# Patient Record
Sex: Female | Born: 1995 | Race: White | Hispanic: No | Marital: Single | Smoking: Never smoker
Health system: Southern US, Community
[De-identification: ages and names within clinical notes are randomized; demographics above are authoritative.]

## PROBLEM LIST (undated history)

## (undated) DIAGNOSIS — D649 Anemia, unspecified: Secondary | ICD-10-CM

## (undated) DIAGNOSIS — E282 Polycystic ovarian syndrome: Secondary | ICD-10-CM

## (undated) DIAGNOSIS — F32A Depression, unspecified: Secondary | ICD-10-CM

## (undated) HISTORY — PX: ANKLE SURGERY: SHX546

## (undated) HISTORY — DX: Anemia, unspecified: D64.9

## (undated) HISTORY — PX: BACK SURGERY: SHX140

---
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
## 2013-07-28 ENCOUNTER — Emergency Department: Payer: Self-pay | Admitting: Emergency Medicine

## 2013-09-16 ENCOUNTER — Ambulatory Visit: Payer: Self-pay | Admitting: Specialist

## 2014-07-19 NOTE — Op Note (Signed)
PATIENT NAME:  Christine Oconnor, Christine Oconnor MR#:  [id number] DATE OF BIRTH:  23-Jun-1995  DATE OF PROCEDURE:  09/16/2013  DATE OF DICTATION: 09/16/2013   PREOPERATIVE DIAGNOSIS: Painful hardware, right ankle.  POSTOPERATIVE DIAGNOSIS: Painful hardware, right ankle.  OPERATION: Removal of hardware, right ankle, from the fibula medial malleolus and talus.  SURGEON: Valinda HoarHoward E. Miller, MD  ANESTHESIA: General LMA.  COMPLICATIONS: None.  DRAINS: None.  ESTIMATED BLOOD LOSS: None.  REPLACED: None.  OPERATIVE PROCEDURE: The patient was brought to the operating room, where she underwent a satisfactory general LMA anesthesia in the supine position. The right leg was prepped and draped in sterile fashion and Esmarch applied. Tourniquet was inflated to 350 mmHg. The previous longitudinal fibula incision was reopened, and dissection carried out sharply through the subcutaneous tissue. The dissection was carried anteriorly to the fibula, and the plate and screws were exposed. The screws were all removed, and the plate was removed without difficulty. The distal screw had broken in the past, and the broken portion was left in the tibia. This wound was then irrigated and closed with 2-0 Vicryl and staples. The medial incision was reopened and dissection carried out sharply through subcutaneous tissue. The medial malleolus was exposed and the two 4.5 cancellous screws exposed. These screws were removed without difficulty. This wound was again irrigated and closed with 3-0 Vicryl and staples. The incision over the anterolateral talus was reopened and dissection carried out down to bone. The 3 small screws were not immediately visible. Some rongeur was used to remove some of the anterolateral bone, and the 2 distal screws were visualized. These were removed. Fluoroscopy showed that the more proximal screw was still in place. Dissection with a rongeur in the lateral gutter finally revealed this screw quite a ways posteriorly  and protruding into the lateral gutter, which was definitely causing symptoms. This screw was removed as well. Fluoroscopy revealed all hardware was removed except for the broken-off screw in the tibia. The wound was irrigated and closed with 3-0 Vicryl and staples. Marcaine 0.5% was placed in all wounds. Dry sterile dressing on the posterior foot was applied. Tourniquet was deflated, with a good return of blood flow to the foot. The patient was awakened and taken to recovery in good condition.   ____________________________ Valinda HoarHoward E. Miller, MD hem:lb D: 09/16/2013 21:25:30 ET T: 09/17/2013 05:46:55 ET JOB#: [id number]  cc: Valinda HoarHoward E. Miller, MD, <Dictator> Valinda HoarHOWARD E MILLER MD ELECTRONICALLY SIGNED 09/17/2013 15:59

## 2014-07-31 ENCOUNTER — Encounter: Payer: Self-pay | Admitting: *Deleted

## 2014-07-31 ENCOUNTER — Emergency Department: Payer: Medicaid Other

## 2014-07-31 ENCOUNTER — Emergency Department
Admission: EM | Admit: 2014-07-31 | Discharge: 2014-07-31 | Disposition: A | Discharge status: Home / Self Care | Payer: Medicaid Other | Attending: Emergency Medicine | Admitting: Emergency Medicine

## 2014-07-31 DIAGNOSIS — S76011A Strain of muscle, fascia and tendon of right hip, initial encounter: Secondary | ICD-10-CM | POA: Insufficient documentation

## 2014-07-31 DIAGNOSIS — Z3202 Encounter for pregnancy test, result negative: Secondary | ICD-10-CM | POA: Insufficient documentation

## 2014-07-31 DIAGNOSIS — W14XXXA Fall from tree, initial encounter: Secondary | ICD-10-CM | POA: Diagnosis not present

## 2014-07-31 DIAGNOSIS — Y9289 Other specified places as the place of occurrence of the external cause: Secondary | ICD-10-CM | POA: Insufficient documentation

## 2014-07-31 DIAGNOSIS — Y9389 Activity, other specified: Secondary | ICD-10-CM | POA: Diagnosis not present

## 2014-07-31 DIAGNOSIS — Y998 Other external cause status: Secondary | ICD-10-CM | POA: Insufficient documentation

## 2014-07-31 DIAGNOSIS — M25551 Pain in right hip: Secondary | ICD-10-CM

## 2014-07-31 DIAGNOSIS — S79911A Unspecified injury of right hip, initial encounter: Secondary | ICD-10-CM | POA: Diagnosis present

## 2014-07-31 LAB — URINALYSIS COMPLETE WITH MICROSCOPIC (ARMC ONLY)
BILIRUBIN URINE: NEGATIVE
Bacteria, UA: NONE SEEN
Glucose, UA: NEGATIVE mg/dL
HGB URINE DIPSTICK: NEGATIVE
Ketones, ur: NEGATIVE mg/dL
Leukocytes, UA: NEGATIVE
Nitrite: NEGATIVE
Protein, ur: NEGATIVE mg/dL
Specific Gravity, Urine: 1.029 (ref 1.005–1.030)
pH: 6 (ref 5.0–8.0)

## 2014-07-31 MED ORDER — TRAMADOL HCL 50 MG PO TABS: 50.0000 mg | ORAL_TABLET | Freq: Three times a day (TID) | ORAL | Status: AC | PRN

## 2014-07-31 MED ORDER — MELOXICAM 7.5 MG PO TABS
ORAL_TABLET | ORAL | Status: AC
  Administered 2014-07-31: 15 mg via ORAL
  Filled 2014-07-31: qty 2

## 2014-07-31 MED ORDER — MELOXICAM 15 MG PO TABS: 15.0000 mg | ORAL_TABLET | Freq: Every day | ORAL | Status: DC | PRN

## 2014-07-31 MED ORDER — MELOXICAM 7.5 MG PO TABS
15.0000 mg | ORAL_TABLET | Freq: Once | ORAL | Status: AC
  Administered 2014-07-31: 15 mg via ORAL

## 2014-07-31 NOTE — ED Provider Notes (Signed)
[facility] Emergency Department Provider Note   ____________________________________________  Time seen: ----------------------------------------- 8:00 PM on 07/31/2014 -----------------------------------------    I have reviewed the triage vital signs and the nursing notes.   HISTORY  Chief Complaint Hip Pain   HPI Christine Oconnor is a 19 y.o. female with a complaint of right hip pain. Patient states approximately 2 years ago she was in an accident. States she fell out of a tree approximately 15 feet. States at that time she fractured her right hip and right ankle and lower back. States right hip was nonsurgically repaired. States right ankle and lower back were surgically repaired. States occasional pain to right hip since then. However denies recent fall or injury. Patient does report the last 2-3 weeks has been playing a Wii videogame called "just dance". States this particular videogame involves dancing and physical activity. Patient states that pain onset was while playing game. Patient reports this playing game has caused pain to her hip.   Patient states pain is aching and increases to a sharp pain with movement at 6/10. States pain presents only with movement to right lateral hip. Denies pain radiation. Pain comes and goes with activity for 2-3 weeks.   Patient denies fall, trauma, or other injury. Denies back pain, abdominal pain, lower leg pain, dysuria, hematuria, vaginal complaints, fever, or recent sickness.   History reviewed. No pertinent past medical history.  There are no active problems to display for this patient.   Past Surgical History  Procedure Laterality Date  . Ankle surgery    . Back surgery      No current outpatient prescriptions on file.  Allergies Review of patient's allergies indicates no known allergies.  No family history on file.  Social History History  Substance Use Topics  . Smoking status: Never Smoker    . Smokeless tobacco: Not on file  . Alcohol Use: No    Review of Systems Constitutional: Negative for fever. Eyes: Negative for visual changes. ENT: Negative for sore throat. Cardiovascular: Negative for chest pain. Respiratory: Negative for shortness of breath. Gastrointestinal: Negative for abdominal pain, vomiting and diarrhea. Genitourinary: Negative for dysuria. Musculoskeletal: Positive for right lateral hip pain. Negative for back pain. Negative for left lower leg pain. Skin: Negative for rash. Neurological: Negative for headaches, focal weakness or numbness.   10-point ROS otherwise negative.  ____________________________________________   PHYSICAL EXAM:  VITAL SIGNS: ED Triage Vitals  Enc Vitals Group     BP 07/31/14 1855 135/74 mmHg     Pulse Rate 07/31/14 1855 100     Resp 07/31/14 1855 18     Temp 07/31/14 1855 98.3 F (36.8 C)     Temp Source 07/31/14 1855 Oral     SpO2 07/31/14 1855 100 %     Weight 07/31/14 1855 200 lb (90.719 kg)     Height 07/31/14 1855 5\' 7"  (1.702 m)     Head Cir --      Peak Flow --      Pain Score 07/31/14 1853 10     Pain Loc --      Pain Edu? --      Excl. in GC? --      Constitutional: Alert and oriented. Well appearing and in no distress. Eyes: Conjunctivae are normal. PERRL. Normal extraocular movements. ENT   Head: Normocephalic and atraumatic.   Nose: No congestion/rhinnorhea.   Mouth/Throat: Mucous membranes are moist.   Neck: No stridor. Hematological/Lymphatic/Immunilogical: No cervical lymphadenopathy. Cardiovascular:  Normal rate, regular rhythm. Normal and symmetric distal pulses are present in all extremities. No murmurs, rubs, or gallops. Respiratory: Normal respiratory effort without tachypnea nor retractions. Breath sounds are clear and equal bilaterally. No wheezes/rales/rhonchi. Gastrointestinal: Soft and nontender. Obese abdomen. No abdominal bruits. There is no CVA  tenderness. Genitourinary: deferred Musculoskeletal: Nontender with normal range of motion in all extremities. No joint effusions.  No lower extremity tenderness nor edema. Pelvis nontender   EXCEPT: Right lateral hip point tender. Full ROM. Mild to mod. Pain with lateral abduction. No swelling, ecchymosis. Skin intact. Distal right lower leg nontender. Distal pedal pulses equal bilaterally.Changes positions quickly from lying to standing without distress.    Neurologic:  Normal speech and language. No gross focal neurologic deficits are appreciated. Speech is normal. No gait instability. Skin:  Skin is warm, dry and intact. No rash noted. Psychiatric: Mood and affect are normal. Speech and behavior are normal. Patient exhibits appropriate insight and judgment.  ____________________________________________    LABS (pertinent positives/negatives)  Labs Reviewed  URINALYSIS COMPLETEWITH MICROSCOPIC ([facility])  - Abnormal; Notable for the following:    Color, Urine YELLOW (*)    APPearance CLEAR (*)    Squamous Epithelial / LPF 0-5 (*)    All other components within normal limits  POC URINE PREG, ED  urine pregnancy negative   ____________________________________________  RADIOLOGY  EXAM: RIGHT HIP (WITH PELVIS) 2-3 VIEWS  COMPARISON: None.  FINDINGS: There is no evidence of hip fracture or dislocation. No evidence of osteonecrosis or stress reaction. No degenerative changes.  Partially visualized lumbar posterior fixation hardware.  IMPRESSION: Negative.   Electronically Signed By: Marnee SpringJonathon Watts M.D. On: 07/31/2014 21:03    INITIAL IMPRESSION / ASSESSMENT AND PLAN / ED COURSE  Pertinent labs & imaging results that were available during my care of the patient were reviewed by me and considered in my medical decision making (see chart for details).  Well appearing. Steady gait. Denies fall or direct trauma. REports right hip pain consistent with recent  increased in physical activity while playing a dance gamer per pt. Denies pain radiation. Pain present only with movement. Suspect strain injury. Hx of fracture. Will xray.   Xray negative for acute changes. Steady gait. Suspect strain. Ice rest and follow up with orthopedic next week as needed. Reports previously follow up [facility]. ____________________________________________   FINAL CLINICAL IMPRESSION(S) / ED DIAGNOSES  Final diagnoses:  Right hip pain  Hip strain, right, initial encounter     Christine DillsLindsey Braylie Badami, NP 07/31/14 2157

## 2014-07-31 NOTE — ED Notes (Signed)
Pt. States pain to the rt. Hip for the past three weeks.  Pt. States increased pain to rt. Hip in past couple days.  Pt. States fracture to rt. Hip in may 2014.

## 2014-07-31 NOTE — ED Notes (Signed)
Pt reports right hip pain that has become worse over a 3 week period. Pt denies recent injury, only reports falling out of a tree 2 years ago with hip injury

## 2014-07-31 NOTE — Discharge Instructions (Signed)
Take medication as prescribed. Stretch. Alternate heat and ice. Avoid strenuous activity.   Follow up with orthopedic next week as discussed.   Return to ER for new or worsening concerns.

## 2014-12-26 ENCOUNTER — Emergency Department: Payer: BLUE CROSS/BLUE SHIELD

## 2014-12-26 ENCOUNTER — Encounter: Payer: Self-pay | Admitting: Emergency Medicine

## 2014-12-26 ENCOUNTER — Emergency Department
Admission: EM | Admit: 2014-12-26 | Discharge: 2014-12-26 | Disposition: A | Discharge status: Home / Self Care | Payer: BLUE CROSS/BLUE SHIELD | Attending: Student | Admitting: Student

## 2014-12-26 DIAGNOSIS — Y9289 Other specified places as the place of occurrence of the external cause: Secondary | ICD-10-CM | POA: Diagnosis not present

## 2014-12-26 DIAGNOSIS — M545 Low back pain, unspecified: Secondary | ICD-10-CM

## 2014-12-26 DIAGNOSIS — Z3202 Encounter for pregnancy test, result negative: Secondary | ICD-10-CM | POA: Insufficient documentation

## 2014-12-26 DIAGNOSIS — X58XXXA Exposure to other specified factors, initial encounter: Secondary | ICD-10-CM | POA: Diagnosis not present

## 2014-12-26 DIAGNOSIS — Y998 Other external cause status: Secondary | ICD-10-CM | POA: Insufficient documentation

## 2014-12-26 DIAGNOSIS — Y9389 Activity, other specified: Secondary | ICD-10-CM | POA: Diagnosis not present

## 2014-12-26 DIAGNOSIS — S32030A Wedge compression fracture of third lumbar vertebra, initial encounter for closed fracture: Secondary | ICD-10-CM | POA: Diagnosis not present

## 2014-12-26 DIAGNOSIS — Z981 Arthrodesis status: Secondary | ICD-10-CM | POA: Diagnosis not present

## 2014-12-26 LAB — POCT PREGNANCY, URINE: Preg Test, Ur: NEGATIVE

## 2014-12-26 MED ORDER — NAPROXEN 500 MG PO TABS: 500.0000 mg | ORAL_TABLET | Freq: Two times a day (BID) | ORAL | Status: DC

## 2014-12-26 MED ORDER — TRAMADOL HCL 50 MG PO TABS
50.0000 mg | ORAL_TABLET | Freq: Once | ORAL | Status: AC
  Administered 2014-12-26: 50 mg via ORAL
  Filled 2014-12-26: qty 1

## 2014-12-26 MED ORDER — TRAMADOL HCL 50 MG PO TABS: 50.0000 mg | ORAL_TABLET | Freq: Four times a day (QID) | ORAL | Status: DC | PRN

## 2014-12-26 MED ORDER — IBUPROFEN 800 MG PO TABS
800.0000 mg | ORAL_TABLET | Freq: Once | ORAL | Status: AC
  Administered 2014-12-26: 800 mg via ORAL
  Filled 2014-12-26: qty 1

## 2014-12-26 NOTE — ED Provider Notes (Signed)
CSN: [id number]     Arrival date & time 12/26/14  1606 History   First MD Initiated Contact with Patient 12/26/14 1714     Chief Complaint  Patient presents with  . Back Pain     (Consider location/radiation/quality/duration/timing/severity/associated sxs/prior Treatment) HPI  19 year old female resist the emergency department for evaluation of lower back pain. She denies any trauma or injury. Pain is been present for 7 days and she describes a gradual onset of sharp shooting pain in the middle of the lower back. Pain is increased after being on her feet for 8 hours at work. She also has increased pain with bending and lifting. She denies any radiation of pain down the lower extremities. She is able to ambulate with no significant discomfort. No weakness or loss of bowel or bladder symptoms. Patient has had back surgery that was performed in 2014 in [place] after a fall 15 feet out of a tree. She suffered a burst fracture at L3 and was treated with L2 L3-L4 fusion. Patient has not been taking any medications for pain today. Her pain is 4 out of 10.  History reviewed. No pertinent past medical history. Past Surgical History  Procedure Laterality Date  . Ankle surgery    . Back surgery     No family history on file. Social History  Substance Use Topics  . Smoking status: Never Smoker   . Smokeless tobacco: None  . Alcohol Use: No   OB History    No data available     Review of Systems  Constitutional: Negative for fever, chills, activity change and fatigue.  HENT: Negative for congestion, sinus pressure and sore throat.   Eyes: Negative for visual disturbance.  Respiratory: Negative for cough, chest tightness and shortness of breath.   Cardiovascular: Negative for chest pain and leg swelling.  Gastrointestinal: Negative for nausea, vomiting, abdominal pain and diarrhea.  Genitourinary: Negative for dysuria.  Musculoskeletal: Positive for back pain. Negative for arthralgias and gait  problem.  Skin: Negative for rash.  Neurological: Negative for weakness, numbness and headaches.  Hematological: Negative for adenopathy.  Psychiatric/Behavioral: Negative for behavioral problems, confusion and agitation.  All other systems reviewed and are negative.     Allergies  Review of patient's allergies indicates no known allergies.  Home Medications   Prior to Admission medications   Medication Sig Start Date End Date Taking? Authorizing Provider  meloxicam (MOBIC) 15 MG tablet Take 1 tablet (15 mg total) by mouth daily as needed for pain. 07/31/14   Renford Dills, NP  naproxen (NAPROSYN) 500 MG tablet Take 1 tablet (500 mg total) by mouth 2 (two) times daily with a meal. 12/26/14   Evon Slack, PA-C  traMADol (ULTRAM) 50 MG tablet Take 1 tablet (50 mg total) by mouth every 8 (eight) hours as needed for moderate pain (Do not drive or operate heavy machinery while taking as can cause drowsiness.). 07/31/14 07/31/15  Renford Dills, NP  traMADol (ULTRAM) 50 MG tablet Take 1 tablet (50 mg total) by mouth every 6 (six) hours as needed. 12/26/14   Evon Slack, PA-C   BP 136/93 mmHg  Pulse 97  Temp(Src) 98.2 F (36.8 C) (Oral)  Resp 20  Ht  (1.676 m)  Wt 280 lb (127.007 kg)  BMI 45.21 kg/m2  SpO2 100%  LMP 10/25/2014 (Approximate) Physical Exam  Constitutional: She is oriented to person, place, and time. She appears well-developed and well-nourished. No distress.  HENT:  Head: Normocephalic and atraumatic.  Mouth/Throat: Oropharynx is clear and moist.  Eyes: EOM are normal. Pupils are equal, round, and reactive to light. Right eye exhibits no discharge. Left eye exhibits no discharge.  Neck: Normal range of motion. Neck supple.  Cardiovascular: Normal rate, regular rhythm and intact distal pulses.   Pulmonary/Chest: Effort normal and breath sounds normal. No respiratory distress. She exhibits no tenderness.  Abdominal: Soft. She exhibits no distension. There is no  tenderness.  Musculoskeletal: Normal range of motion. She exhibits no edema.  Examination of the lumbar spine shows patient has mild tenderness to palpation along the lumbosacral junction along the spinous process in left and right paravertebral muscles. There is no soft tissue abnormality, warmth erythema or muscle spasms noted. Patient has no significant pain with flexion and extension lateral bending or rotation of the lumbar spine. Examination of the lower extremities show patient has full range of motion of the hips knees and ankles. There is 5 out of 5 strength with hip abduction, hip abduction, hip flexion, knee flexion, knee extension, ankle plantarflexion, ankle dorsiflexion. His station is intact throughout. 2+ patellar reflexes. Straight leg raise bilaterally. Neurovascular intact in bilateral lower extremities.  Neurological: She is alert and oriented to person, place, and time. She has normal reflexes.  Skin: Skin is warm and dry.  Psychiatric: She has a normal mood and affect. Her behavior is normal. Thought content normal.    ED Course  Procedures (including critical care time) Labs Review Labs Reviewed  POC URINE PREG, ED  POCT PREGNANCY, URINE    Imaging Review Dg Lumbar Spine Complete  12/26/2014   CLINICAL DATA:  Back pain today. Patient status post prior lumbar surgery.  EXAM: LUMBAR SPINE - COMPLETE 4+ VIEW  COMPARISON:  None.  FINDINGS: There is compression deformity with lucencies in the L3 vertebral body. Patient status post prior posterior fusion of L2 through L5. The two horizontal screws fixating the L2 level are discontinuous.  IMPRESSION: Findings suspicious for acute compression fracture of L3 and fracture of horizontal screws fixating the L2 level.   Electronically Signed   By: Sherian Rein M.D.   On: 12/26/2014 18:39   I have personally reviewed and evaluated these images and lab results as part of my medical decision-making.   EKG Interpretation None       MDM   Final diagnoses:  Compression fracture of L3 lumbar vertebra, closed, initial encounter  Status post lumbar spinal fusion  Midline low back pain without sciatica    19 year old female with nontraumatic lower back pain. She has a history of L2-L4 fusion with L3 burst compression fracture that occurred in 2014. X-ray report from January 2015 in [place] notes burst fracture at L3 with retropulsion. Lumbar spine x-rays taken today at [facility] also show L3 compression fracture with failure of the top 2 screws. She will follow-up with orthopedic physician first thing Monday am she is given a note to remain out of work for the next week. She will rest. Turn to the ER for any worsening symptoms or urgent changes in her health. She is given a prescription for tramadol and ibuprofen for pain.  Evon Slack, PA-C 12/26/14 2202  Gayla Doss, MD 12/27/14 [phone]

## 2014-12-26 NOTE — Discharge Instructions (Signed)
Back Pain, Adult °Low back pain is very common. About 1 in 5 people have back pain. The cause of low back pain is rarely dangerous. The pain often gets better over time. About half of people with a sudden onset of back pain feel better in just 2 weeks. About 8 in 10 people feel better by 6 weeks.  °CAUSES °Some common causes of back pain include: °· Strain of the muscles or ligaments supporting the spine. °· Wear and tear (degeneration) of the spinal discs. °· Arthritis. °· Direct injury to the back. °DIAGNOSIS °Most of the time, the direct cause of low back pain is not known. However, back pain can be treated effectively even when the exact cause of the pain is unknown. Answering your caregiver's questions about your overall health and symptoms is one of the most accurate ways to make sure the cause of your pain is not dangerous. If your caregiver needs more information, he or she may order lab work or imaging tests (X-rays or MRIs). However, even if imaging tests show changes in your back, this usually does not require surgery. °HOME CARE INSTRUCTIONS °For many people, back pain returns. Since low back pain is rarely dangerous, it is often a condition that people can learn to manage on their own.  °· Remain active. It is stressful on the back to sit or stand in one place. Do not sit, drive, or stand in one place for more than 30 minutes at a time. Take short walks on level surfaces as soon as pain allows. Try to increase the length of time you walk each day. °· Do not stay in bed. Resting more than 1 or 2 days can delay your recovery. °· Do not avoid exercise or work. Your body is made to move. It is not dangerous to be active, even though your back may hurt. Your back will likely heal faster if you return to being active before your pain is gone. °· Pay attention to your body when you  bend and lift. Many people have less discomfort when lifting if they bend their knees, keep the load close to their bodies, and  avoid twisting. Often, the most comfortable positions are those that put less stress on your recovering back. °· Find a comfortable position to sleep. Use a firm mattress and lie on your side with your knees slightly bent. If you lie on your back, put a pillow under your knees. °· Only take over-the-counter or prescription medicines as directed by your caregiver. Over-the-counter medicines to reduce pain and inflammation are often the most helpful. Your caregiver may prescribe muscle relaxant drugs. These medicines help dull your pain so you can more quickly return to your normal activities and healthy exercise. °· Put ice on the injured area. °¨ Put ice in a plastic bag. °¨ Place a towel between your skin and the bag. °¨ Leave the ice on for 15-20 minutes, 03-04 times a day for the first 2 to 3 days. After that, ice and heat may be alternated to reduce pain and spasms. °· Ask your caregiver about trying back exercises and gentle massage. This may be of some benefit. °· Avoid feeling anxious or stressed. Stress increases muscle tension and can worsen back pain. It is important to recognize when you are anxious or stressed and learn ways to manage it. Exercise is a great option. °SEEK MEDICAL CARE IF: °· You have pain that is not relieved with rest or medicine. °· You have pain that does not improve in 1 week. °· You have new symptoms. °· You are generally not feeling well. °SEEK   IMMEDIATE MEDICAL CARE IF:  °· You have pain that radiates from your back into your legs. °· You develop new bowel or bladder control problems. °· You have unusual weakness or numbness in your arms or legs. °· You develop nausea or vomiting. °· You develop abdominal pain. °· You feel faint. °Document Released: 03/14/2005 Document Revised: 09/13/2011 Document Reviewed: 07/16/2013 °ExitCare® Patient Information ©2015 [employer]. This information is not intended to replace advice given to you by your health care provider. Make sure you  discuss any questions you have with your health care provider. ° °Back, Compression Fracture °A compression fracture happens when a force is put upon the length of your spine. Slipping and falling on your bottom are examples of such a force. When this happens, sometimes the force is great enough to compress the building blocks (vertebral bodies) of your spine. Although this causes a lot of pain, this can usually be treated at home, unless your caregiver feels hospitalization is needed for pain control. °Your backbone (spinal column) is made up of 24 main vertebral bodies in addition to the sacrum and coccyx (see illustration). These are held together by tough fibrous tissues (ligaments) and by support of your muscles. Nerve roots pass through the openings between the vertebrae. A sudden wrenching move, injury, or a fall may cause a compression fracture of one of the vertebral bodies. This may result in back pain or spread of pain into the belly (abdomen), the buttocks, and down the leg into the foot. Pain may also be created by muscle spasm alone. °Large studies have been undertaken to determine the best possible course of action to help your back following injury and also to prevent future problems. The recommendations are as follows. °FOLLOWING A COMPRESSION FRACTURE: °Do the following only if advised by your caregiver.  °· If a back brace has been suggested or provided, wear it as directed. °· Do not stop wearing the back brace unless instructed by your caregiver. °· When allowed to return to regular activities, avoid a sedentary lifestyle. Actively exercise. Sporadic weekend binges of tennis, racquetball, or waterskiing may actually aggravate or create problems, especially if you are not in condition for that activity. °· Avoid sports requiring sudden body movements until you are in condition for them. Swimming and walking are safer activities. °· Maintain good posture. °· Avoid obesity. °· If not already done,  you should have a DEXA scan. Based on the results, be treated for osteoporosis. °FOLLOWING ACUTE (SUDDEN) INJURY: °· Only take over-the-counter or prescription medicines for pain, discomfort, or fever as directed by your caregiver. °· Use bed rest for only the most extreme acute episode. Prolonged bed rest may aggravate your condition. Ice used for acute conditions is effective. Use a large plastic bag filled with ice. Wrap it in a towel. This also provides excellent pain relief. This may be continuous. Or use it for 30 minutes every 2 hours during acute phase, then as needed. Heat for 30 minutes prior to activities is helpful. °· As soon as the acute phase (the time when your back is too painful for you to do normal activities) is over, it is important to resume normal activities and work hardening programs. Back injuries can cause potentially marked changes in lifestyle. So it is important to attack these problems aggressively. °· See your caregiver for continued problems. He or she can help or refer you for appropriate exercises, physical therapy, and work hardening if needed. °· If you are given narcotic   medications for your condition, for the next 24 hours do not: °¨ Drive. °¨ Operate machinery or power tools. °¨ Sign legal documents. °· Do not drink alcohol, or take sleeping pills or other medications that may interfere with treatment. °If your caregiver has given you a follow-up appointment, it is very important to keep that appointment. Not keeping the appointment could result in a chronic or permanent injury, pain, and disability. If there is any problem keeping the appointment, you must call back to this facility for assistance.  °SEEK IMMEDIATE MEDICAL CARE IF: °· You develop numbness, tingling, weakness, or problems with the use of your arms or legs. °· You develop severe back pain not relieved with medications. °· You have changes in bowel or bladder control. °· You have increasing pain in any areas of  the body. °Document Released: 03/14/2005 Document Revised: 07/29/2013 Document Reviewed: 10/17/2007 °ExitCare® Patient Information ©2015 [employer]. This information is not intended to replace advice given to you by your health care provider. Make sure you discuss any questions you have with your health care provider. ° °

## 2014-12-26 NOTE — ED Notes (Signed)
States she felt a pop in her back about 1 week ago now having pain to left hip area and back

## 2014-12-26 NOTE — ED Notes (Signed)
AAOx3.  Skin warm and dry.  NAD 

## 2014-12-26 NOTE — ED Notes (Signed)
Says she has left low back pain and has had back problems before and has had surgery there.
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
## 2015-10-06 ENCOUNTER — Encounter: Payer: Self-pay | Admitting: Emergency Medicine

## 2015-10-06 ENCOUNTER — Emergency Department
Admission: EM | Admit: 2015-10-06 | Discharge: 2015-10-06 | Disposition: A | Discharge status: Home / Self Care | Payer: BLUE CROSS/BLUE SHIELD | Attending: Emergency Medicine | Admitting: Emergency Medicine

## 2015-10-06 DIAGNOSIS — K625 Hemorrhage of anus and rectum: Secondary | ICD-10-CM | POA: Insufficient documentation

## 2015-10-06 HISTORY — DX: Polycystic ovarian syndrome: E28.2

## 2015-10-06 LAB — CBC
HCT: 37.3 % (ref 35.0–47.0)
Hemoglobin: 12.3 g/dL (ref 12.0–16.0)
MCH: 24.9 pg — ABNORMAL LOW (ref 26.0–34.0)
MCHC: 33.1 g/dL (ref 32.0–36.0)
MCV: 75.4 fL — ABNORMAL LOW (ref 80.0–100.0)
PLATELETS: 288 10*3/uL (ref 150–440)
RBC: 4.95 MIL/uL (ref 3.80–5.20)
RDW: 15.4 % — AB (ref 11.5–14.5)
WBC: 10.7 10*3/uL (ref 3.6–11.0)

## 2015-10-06 LAB — COMPREHENSIVE METABOLIC PANEL
ALBUMIN: 3.9 g/dL (ref 3.5–5.0)
ALT: 52 U/L (ref 14–54)
AST: 32 U/L (ref 15–41)
Alkaline Phosphatase: 100 U/L (ref 38–126)
Anion gap: 7 (ref 5–15)
BUN: 10 mg/dL (ref 6–20)
CO2: 25 mmol/L (ref 22–32)
CREATININE: 0.72 mg/dL (ref 0.44–1.00)
Calcium: 9 mg/dL (ref 8.9–10.3)
Chloride: 102 mmol/L (ref 101–111)
GFR calc Af Amer: >60 mL/min (ref >60)
GLUCOSE: 92 mg/dL (ref 65–99)
POTASSIUM: 4 mmol/L (ref 3.5–5.1)
Sodium: 134 mmol/L — ABNORMAL LOW (ref 135–145)
Total Bilirubin: 0.7 mg/dL (ref 0.3–1.2)
Total Protein: 7.8 g/dL (ref 6.5–8.1)

## 2015-10-06 LAB — URINALYSIS COMPLETE WITH MICROSCOPIC (ARMC ONLY)
BACTERIA UA: NONE SEEN
BILIRUBIN URINE: NEGATIVE
GLUCOSE, UA: NEGATIVE mg/dL
HGB URINE DIPSTICK: NEGATIVE
Leukocytes, UA: NEGATIVE
Nitrite: NEGATIVE
Protein, ur: 30 mg/dL — AB
Specific Gravity, Urine: 1.027 (ref 1.005–1.030)
Squamous Epithelial / LPF: NONE SEEN
pH: 8 (ref 5.0–8.0)

## 2015-10-06 LAB — PREGNANCY, URINE: Preg Test, Ur: NEGATIVE

## 2015-10-06 NOTE — ED Notes (Signed)
Pt to ed with c/o rectal bleeding.  Pt reports she noticed bright red blood noted in liquid stool today.  States it increased as the day went on today.  Pt states she noticed clots in most recent bm.

## 2015-10-06 NOTE — Discharge Instructions (Signed)
Follow-up with the clinic in 2 days for reevaluation. Return to the emergency department if you continue to have bleeding after 24 hours, if your bleeding gets progressively worse, if you feel dizzy, if you develop chest pain, shortness of breath, abdominal pain or any new symptoms concerning to you.  Gastrointestinal Bleeding Gastrointestinal (GI) bleeding means there is bleeding somewhere along the digestive tract, between the mouth and anus. CAUSES  There are many different problems that can cause GI bleeding. Possible causes include:  Esophagitis. This is inflammation, irritation, or swelling of the esophagus.  Hemorrhoids.These are veins that are full of blood (engorged) in the rectum. They cause pain, inflammation, and may bleed.  Anal fissures.These are areas of painful tearing which may bleed. They are often caused by passing hard stool.  Diverticulosis.These are pouches that form on the colon over time, with age, and may bleed significantly.  Diverticulitis.This is inflammation in areas with diverticulosis. It can cause pain, fever, and bloody stools, although bleeding is rare.  Polyps and cancer. Colon cancer often starts out as precancerous polyps.  Gastritis and ulcers.Bleeding from the upper gastrointestinal tract (near the stomach) may travel through the intestines and produce black, sometimes tarry, often bad smelling stools. In certain cases, if the bleeding is fast enough, the stools may not be black, but red. This condition may be life-threatening. SYMPTOMS   Vomiting bright red blood or material that looks like coffee grounds.  Bloody, black, or tarry stools. DIAGNOSIS  Your caregiver may diagnose your condition by taking your history and performing a physical exam. More tests may be needed, including:  X-rays and other imaging tests.  Esophagogastroduodenoscopy (EGD). This test uses a flexible, lighted tube to look at your esophagus, stomach, and small  intestine.  Colonoscopy. This test uses a flexible, lighted tube to look at your colon. TREATMENT  Treatment depends on the cause of your bleeding.   For bleeding from the esophagus, stomach, small intestine, or colon, the caregiver doing your EGD or colonoscopy may be able to stop the bleeding as part of the procedure.  Inflammation or infection of the colon can be treated with medicines.  Many rectal problems can be treated with creams, suppositories, or warm baths.  Surgery is sometimes needed.  Blood transfusions are sometimes needed if you have lost a lot of blood. If bleeding is slow, you may be allowed to go home. If there is a lot of bleeding, you will need to stay in the hospital for observation. HOME CARE INSTRUCTIONS   Take any medicines exactly as prescribed.  Keep your stools soft by eating foods that are high in fiber. These foods include whole grains, legumes, fruits, and vegetables. Prunes (1 to 3 a day) work well for many people.  Drink enough fluids to keep your urine clear or pale yellow. SEEK IMMEDIATE MEDICAL CARE IF:   Your bleeding increases.  You feel lightheaded, weak, or you faint.  You have severe cramps in your back or abdomen.  You pass large blood clots in your stool.  Your problems are getting worse. MAKE SURE YOU:   Understand these instructions.  Will watch your condition.  Will get help right away if you are not doing well or get worse.   This information is not intended to replace advice given to you by your health care provider. Make sure you discuss any questions you have with your health care provider.   Document Released: 03/11/2000 Document Revised: 02/29/2012 Document Reviewed: 09/01/2014 Elsevier Interactive Patient  Education ©2016 [employer] ° °

## 2015-10-06 NOTE — ED Provider Notes (Signed)
[facility] Emergency Department Provider Note  ____________________________________________  Time seen: Approximately 8:36 PM  I have reviewed the triage vital signs and the nursing notes.   HISTORY  Chief Complaint Rectal Bleeding   HPI Christine Oconnor is a 20 y.o. female with h/o PCOS presents for evaluation of rectal bleeding. Patient reports 2 episodes of bright red blood per rectum. She describes the total amount of blood she lost half of one of the urine sample cups. She denies any prior history of GI bleed. She denies NSAIDs. She denies abdominal pain, nausea, vomiting. She reports that her stool is watery. She has a history of chronic diarrhea for a few months. The last one was earlier today. She denies dizziness, chest pain, shortness of breath. She does not take any blood thinners. She denies any history of hemorrhoids. She denies any recent travel, any different foods, fever, nausea, vomiting. She had mild abdominal cramping earlier today but that has resolved.  Past Medical History  Diagnosis Date  . PCOS (polycystic ovarian syndrome)     There are no active problems to display for this patient.   Past Surgical History  Procedure Laterality Date  . Ankle surgery    . Back surgery      Current Outpatient Rx  Name  Route  Sig  Dispense  Refill  . meloxicam (MOBIC) 15 MG tablet   Oral   Take 1 tablet (15 mg total) by mouth daily as needed for pain.   10 tablet   0   . naproxen (NAPROSYN) 500 MG tablet   Oral   Take 1 tablet (500 mg total) by mouth 2 (two) times daily with a meal.   30 tablet   0   . traMADol (ULTRAM) 50 MG tablet   Oral   Take 1 tablet (50 mg total) by mouth every 6 (six) hours as needed.   20 tablet   0     Allergies Review of patient's allergies indicates no known allergies.  History reviewed. No pertinent family history.  Social History Social History  Substance Use Topics  . Smoking status: Never  Smoker   . Smokeless tobacco: None  . Alcohol Use: No    Review of Systems  Constitutional: Negative for fever. Eyes: Negative for visual changes. ENT: Negative for sore throat. Cardiovascular: Negative for chest pain. Respiratory: Negative for shortness of breath. Gastrointestinal: Negative for abdominal pain, vomiting or diarrhea. + BRBPR Genitourinary: Negative for dysuria. Musculoskeletal: Negative for back pain. Skin: Negative for rash. Neurological: Negative for headaches, weakness or numbness.  ____________________________________________   PHYSICAL EXAM:  VITAL SIGNS: ED Triage Vitals  Enc Vitals Group     BP 10/06/15 1859 143/100 mmHg     Pulse Rate 10/06/15 1859 82     Resp 10/06/15 1859 20     Temp 10/06/15 1859 98.9 F (37.2 C)     Temp Source 10/06/15 1859 Oral     SpO2 10/06/15 1859 100 %     Weight 10/06/15 1859 300 lb (136.079 kg)     Height 10/06/15 1859 5\' 6"  (1.676 m)     Head Cir --      Peak Flow --      Pain Score 10/06/15 1900 1     Pain Loc --      Pain Edu? --      Excl. in GC? --     Constitutional: Alert and oriented. Well appearing and in no apparent distress. HEENT:  Head: Normocephalic and atraumatic.         Eyes: Conjunctivae are normal. Sclera is non-icteric. EOMI. PERRL      Mouth/Throat: Mucous membranes are moist.       Neck: Supple with no signs of meningismus. Cardiovascular: Regular rate and rhythm. No murmurs, gallops, or rubs. 2+ symmetrical distal pulses are present in all extremities. No JVD. Respiratory: Normal respiratory effort. Lungs are clear to auscultation bilaterally. No wheezes, crackles, or rhonchi.  Gastrointestinal: Soft, non tender, and non distended with positive bowel sounds. No rebound or guarding.Rectal exam with no stool in the vault and Hemoccult negative  Genitourinary: No blood in the introitus of the vagina Neurologic: Normal speech and language. Face is symmetric. Moving all extremities. No  gross focal neurologic deficits are appreciated. Skin: Skin is warm, dry and intact. No rash noted. Psychiatric: Mood and affect are normal. Speech and behavior are normal.  ____________________________________________   LABS (all labs ordered are listed, but only abnormal results are displayed)  Labs Reviewed  COMPREHENSIVE METABOLIC PANEL - Abnormal; Notable for the following:    Sodium 134 (*)    All other components within normal limits  CBC - Abnormal; Notable for the following:    MCV 75.4 (*)    MCH 24.9 (*)    RDW 15.4 (*)    All other components within normal limits  URINALYSIS COMPLETEWITH MICROSCOPIC ([facility] ONLY) - Abnormal; Notable for the following:    Color, Urine YELLOW (*)    APPearance CLEAR (*)    Ketones, ur 1+ (*)    Protein, ur 30 (*)    All other components within normal limits  GASTROINTESTINAL PANEL BY PCR, STOOL (REPLACES STOOL CULTURE)  PREGNANCY, URINE  POC OCCULT BLOOD, ED   ____________________________________________  EKG  None ____________________________________________  RADIOLOGY  none  ____________________________________________   PROCEDURES  Procedure(s) performed: None Critical Care performed:  None ____________________________________________   INITIAL IMPRESSION / ASSESSMENT AND PLAN / ED COURSE  20 y.o. female with h/o PCOS presents for evaluation of 2 episodes of bright red blood per rectum, small amounts, has resolved at this time. Patient hemodynamically stable, no abdominal pain, no other complaints at this time. Rectal exam with no stool in the vault and Hemoccult negative, no blood in her vagina. Labs show a stable H&H, normal kidney function, UA with no evidence of infection or blood. Diagnosis possibly internal hemorrhoids versus infectious diarrhea. Will sedn sample of stool to the lab. No indication for admission or transfusion.  Pertinent labs & imaging results that were available during my care of the patient were  reviewed by me and considered in my medical decision making (see chart for details).    ____________________________________________   FINAL CLINICAL IMPRESSION(S) / ED DIAGNOSES  Final diagnoses:  BRBPR (bright red blood per rectum)      NEW MEDICATIONS STARTED DURING THIS VISIT:  New Prescriptions   No medications on file     Note:  This document was prepared using Dragon voice recognition software and may include unintentional dictation errors.    Nita Sickle, MD 10/06/15 2212

## 2016-12-15 DIAGNOSIS — E282 Polycystic ovarian syndrome: Secondary | ICD-10-CM | POA: Insufficient documentation

## 2017-04-03 ENCOUNTER — Emergency Department: Payer: 59

## 2017-04-03 ENCOUNTER — Emergency Department
Admission: EM | Admit: 2017-04-03 | Discharge: 2017-04-03 | Disposition: A | Discharge status: Home / Self Care | Payer: 59 | Attending: Emergency Medicine | Admitting: Emergency Medicine

## 2017-04-03 ENCOUNTER — Other Ambulatory Visit: Payer: Self-pay

## 2017-04-03 ENCOUNTER — Encounter: Payer: Self-pay | Admitting: Emergency Medicine

## 2017-04-03 DIAGNOSIS — M545 Low back pain, unspecified: Secondary | ICD-10-CM

## 2017-04-03 DIAGNOSIS — Z79899 Other long term (current) drug therapy: Secondary | ICD-10-CM | POA: Diagnosis not present

## 2017-04-03 LAB — URINALYSIS, COMPLETE (UACMP) WITH MICROSCOPIC
Bilirubin Urine: NEGATIVE
Glucose, UA: NEGATIVE mg/dL
HGB URINE DIPSTICK: NEGATIVE
Ketones, ur: NEGATIVE mg/dL
NITRITE: NEGATIVE
PROTEIN: NEGATIVE mg/dL
SPECIFIC GRAVITY, URINE: 1.027 (ref 1.005–1.030)
pH: 7 (ref 5.0–8.0)

## 2017-04-03 LAB — POCT PREGNANCY, URINE: Preg Test, Ur: NEGATIVE

## 2017-04-03 MED ORDER — MELOXICAM 15 MG PO TABS: 15.0000 mg | ORAL_TABLET | Freq: Every day | ORAL | 1 refills | Status: AC

## 2017-04-03 MED ORDER — CYCLOBENZAPRINE HCL 10 MG PO TABS
10.0000 mg | ORAL_TABLET | Freq: Once | ORAL | Status: AC
  Administered 2017-04-03: 10 mg via ORAL
  Filled 2017-04-03: qty 1

## 2017-04-03 MED ORDER — KETOROLAC TROMETHAMINE 10 MG PO TABS: 10.0000 mg | ORAL_TABLET | Freq: Three times a day (TID) | ORAL | 0 refills | Status: DC

## 2017-04-03 MED ORDER — KETOROLAC TROMETHAMINE 30 MG/ML IJ SOLN
30.0000 mg | Freq: Once | INTRAMUSCULAR | Status: AC
  Administered 2017-04-03: 30 mg via INTRAMUSCULAR
  Filled 2017-04-03: qty 1

## 2017-04-03 MED ORDER — GABAPENTIN 100 MG PO CAPS: 100.0000 mg | ORAL_CAPSULE | Freq: Every day | ORAL | 0 refills | Status: DC

## 2017-04-03 MED ORDER — CYCLOBENZAPRINE HCL 5 MG PO TABS: 5.0000 mg | ORAL_TABLET | Freq: Three times a day (TID) | ORAL | 0 refills | Status: DC | PRN

## 2017-04-03 NOTE — Discharge Instructions (Signed)
Your exam likely represents some muscle strain and back pain related to your underlying mild arthritis. Take the prescription meds as directed. [facility]. Return to the ED as discussed.

## 2017-04-03 NOTE — ED Triage Notes (Signed)
Low back pain radiating to both thighs began yesterday. Denies injury.

## 2017-04-03 NOTE — ED Provider Notes (Signed)
[name] Emergency Department Provider Note ____________________________________________  Time seen: 1937  I have reviewed the triage vital signs and the nursing notes.  HISTORY  Chief Complaint  Back Pain  HPI Christine Oconnor is a 22 y.o. female presents to the ED for evaluation of low back pain with bilateral anterior thigh pain. She notes onset yesterday, without offending factors. She notes pain is worse with sitting. She denies any distal paresthesias, foot drop, bladder/bowel incontinence. She has a history of L3 compression fracture s/p L2-L454fusion. She had failure of the screws and subsequent [name]. She presents today with tightness and pressure to the lower back with referral to the thighs. She has taken cyclobenzaprine and meloxicam without relief.   Past Medical History:  Diagnosis Date  . PCOS (polycystic ovarian syndrome)     There are no active problems to display for this patient.   Past Surgical History:  Procedure Laterality Date  . ANKLE SURGERY    . BACK SURGERY      Prior to Admission medications   Medication Sig Start Date End Date Taking? Authorizing Provider  cyclobenzaprine (FLEXERIL) 5 MG tablet Take 1 tablet (5 mg total) by mouth 3 (three) times daily as needed for muscle spasms. 04/03/17   Margrete Delude, Charlesetta IvoryJenise V Bacon, PA-C  gabapentin (NEURONTIN) 100 MG capsule Take 1 capsule (100 mg total) by mouth at bedtime. 04/03/17 05/03/17  Marlene Pfluger, Charlesetta IvoryJenise V Bacon, PA-C  ketorolac (TORADOL) 10 MG tablet Take 1 tablet (10 mg total) by mouth every 8 (eight) hours. 04/03/17   Lisaanne Lawrie, Charlesetta IvoryJenise V Bacon, PA-C  meloxicam (MOBIC) 15 MG tablet Take 1 tablet (15 mg total) by mouth daily as needed for pain. 07/31/14   Renford DillsMiller, Lindsey, NP  meloxicam (MOBIC) 15 MG tablet Take 1 tablet (15 mg total) by mouth daily. 04/03/17 06/02/17  Ritchard Paragas, Charlesetta IvoryJenise V Bacon, PA-C    Allergies Patient has no known allergies.  No family history on  file.  Social History Social History   Tobacco Use  . Smoking status: Never Smoker  Substance Use Topics  . Alcohol use: No  . Drug use: No    Review of Systems  Constitutional: Negative for fever. Cardiovascular: Negative for chest pain. Respiratory: Negative for shortness of breath. Gastrointestinal: Negative for abdominal pain, vomiting and diarrhea. Genitourinary: Negative for dysuria. Musculoskeletal: Positive for back pain. Skin: Negative for rash. Neurological: Negative for headaches, focal weakness or numbness. ____________________________________________  PHYSICAL EXAM:  VITAL SIGNS: ED Triage Vitals  Enc Vitals Group     BP 04/03/17 1842 131/87     Pulse Rate 04/03/17 1842 80     Resp 04/03/17 1842 18     Temp 04/03/17 1842 99 F (37.2 C)     Temp Source 04/03/17 1842 Oral     SpO2 04/03/17 1842 98 %     Weight 04/03/17 1843 (!) 310 lb (140.6 kg)     Height 04/03/17 1843 5\' 6"  (1.676 m)     Head Circumference --      Peak Flow --      Pain Score 04/03/17 1842 6     Pain Loc --      Pain Edu? --      Excl. in GC? --     Constitutional: Alert and oriented. Well appearing and in no distress. Head: Normocephalic and atraumatic. Cardiovascular: Normal rate, regular rhythm. Normal distal pulses. Respiratory: Normal respiratory effort. No wheezes/rales/rhonchi. Gastrointestinal: Soft and nontender. No distention.  Musculoskeletal: No spinal alignment without midline tenderness, spasm, deformity, or step-off.  Patient with stable appearing lumbar sacral surgical scars.  She is with some tenderness palpation over the SI joints and the central sacrum.  She transitions from sit to stand without assistance.  Normal toe and heel raise on exam.  Normal lumbar flexion and extension range are noted.  She also has normal hip flexion on exam.  Nontender with normal range of motion in all extremities.  Neurologic: Nerves II through XII grossly intact.  Normal LE DTRs.   Normal toe dorsiflexion foot eversion.  Normal gait without ataxia. Normal speech and language. No gross focal neurologic deficits are appreciated. Skin:  Skin is warm, dry and intact. No rash noted. Psychiatric: Mood and affect are normal. Patient exhibits appropriate insight and judgment. ____________________________________________   LABS (pertinent positives/negatives)  Labs Reviewed  URINALYSIS, COMPLETE (UACMP) WITH MICROSCOPIC - Abnormal; Notable for the following components:      Result Value   Color, Urine YELLOW (*)    APPearance CLOUDY (*)    Leukocytes, UA TRACE (*)    Bacteria, UA RARE (*)    Squamous Epithelial / LPF 6-30 (*)    All other components within normal limits  POCT PREGNANCY, URINE  POC URINE PREG, ED  ____________________________________________   RADIOLOGY  Lumbar Spine   IMPRESSION: 1. No acute abnormality. 2. Remote L3 compression fracture unchanged in alignment. Majority of the prior posterior fusion hardware has been removed, the L2 pedicle screws remain in situ.  I, [name], Charlesetta Ivory, personally viewed and evaluated these images (plain radiographs) as part of my medical decision making, as well as reviewing the written report by the radiologist. ____________________________________________  PROCEDURES  Procedures Toradol 30 mg IM Flexeril 10 mg PO ____________________________________________  INITIAL IMPRESSION / ASSESSMENT AND PLAN / ED COURSE  Presents to the ED with acute back pain over the last 24 hours.  No preceding injury, accident, trauma reported.  She denies any distal paresthesias but does report bilateral thigh muscle pain.  Her history is significant for previous L3 compression fracture with fusion and subsequent fusion hardware removal in January 2017.  Patient's exam is overall benign without any acute neuromuscular deficit.  Her x-rays also reassuring at this time.  She reports improvement of symptoms following the  medication administration.  She will be discharged with prescriptions for ketorolac, Flexeril, Neurontin, and meloxicam to dose long-term.  She is further advised to follow-up with [facility] neurosurgery for ongoing symptoms.  Return precautions have been reviewed. ____________________________________________  FINAL CLINICAL IMPRESSION(S) / ED DIAGNOSES  Final diagnoses:  Acute midline low back pain without sciatica      Karmen Stabs, Charlesetta Ivory, PA-C 04/03/17 2316    Rockne Menghini, MD 04/03/17 [id number]

## 2017-08-09 ENCOUNTER — Emergency Department
Admission: EM | Admit: 2017-08-09 | Discharge: 2017-08-09 | Disposition: A | Discharge status: Home / Self Care | Payer: 59 | Attending: Emergency Medicine | Admitting: Emergency Medicine

## 2017-08-09 ENCOUNTER — Encounter: Payer: Self-pay | Admitting: Emergency Medicine

## 2017-08-09 ENCOUNTER — Other Ambulatory Visit: Payer: Self-pay

## 2017-08-09 DIAGNOSIS — J209 Acute bronchitis, unspecified: Secondary | ICD-10-CM | POA: Insufficient documentation

## 2017-08-09 DIAGNOSIS — R05 Cough: Secondary | ICD-10-CM | POA: Diagnosis present

## 2017-08-09 MED ORDER — PREDNISONE 10 MG (21) PO TBPK: ORAL_TABLET | ORAL | 0 refills | Status: DC

## 2017-08-09 MED ORDER — AZITHROMYCIN 250 MG PO TABS: ORAL_TABLET | ORAL | 0 refills | Status: DC

## 2017-08-09 MED ORDER — IPRATROPIUM-ALBUTEROL 0.5-2.5 (3) MG/3ML IN SOLN
3.0000 mL | Freq: Once | RESPIRATORY_TRACT | Status: AC
  Administered 2017-08-09: 3 mL via RESPIRATORY_TRACT
  Filled 2017-08-09: qty 3

## 2017-08-09 NOTE — ED Provider Notes (Signed)
[facility] Emergency Department Provider Note  ____________________________________________   First MD Initiated Contact with Patient 08/09/17 1351     (approximate)  I have reviewed the triage vital signs and the nursing notes.   HISTORY  Chief Complaint Cough    HPI DEISY OZBUN is a 22 y.o. female presents emergency department complaining of cough and congestion.  States that symptoms for 3 days.  States she feels like she is having difficulty breathing.  Her friend was just diagnosed with pneumonia and she is concerned she has the same.  She denies any fever or chills.  No vomiting or diarrhea.  No chest pain  Past Medical History:  Diagnosis Date  . PCOS (polycystic ovarian syndrome)     There are no active problems to display for this patient.   Past Surgical History:  Procedure Laterality Date  . ANKLE SURGERY    . BACK SURGERY      Prior to Admission medications   Medication Sig Start Date End Date Taking? Authorizing Provider  azithromycin (ZITHROMAX Z-PAK) 250 MG tablet 2 pills today then 1 pill a day for 4 days 08/09/17   Sherrie Mustache Roselyn Bering, PA-C  gabapentin (NEURONTIN) 100 MG capsule Take 1 capsule (100 mg total) by mouth at bedtime. 04/03/17 05/03/17  Menshew, Charlesetta Ivory, PA-C  meloxicam (MOBIC) 15 MG tablet Take 1 tablet (15 mg total) by mouth daily as needed for pain. 07/31/14   Renford Dills, NP  predniSONE (STERAPRED UNI-PAK 21 TAB) 10 MG (21) TBPK tablet Take 6 pills on day one then decrease by 1 pill each day 08/09/17   Faythe Ghee, PA-C    Allergies Patient has no known allergies.  No family history on file.  Social History Social History   Tobacco Use  . Smoking status: Never Smoker  . Smokeless tobacco: Never Used  Substance Use Topics  . Alcohol use: No  . Drug use: No    Review of Systems  Constitutional: No fever/chills Eyes: No visual changes. ENT: No sore throat. Respiratory: Positive for  cough Genitourinary: Negative for dysuria. Musculoskeletal: Negative for back pain. Skin: Negative for rash.    ____________________________________________   PHYSICAL EXAM:  VITAL SIGNS: ED Triage Vitals  Enc Vitals Group     BP 08/09/17 1400 125/72     Pulse Rate 08/09/17 1400 (!) 113     Resp 08/09/17 1400 20     Temp 08/09/17 1400 98.2 F (36.8 C)     Temp Source 08/09/17 1400 Oral     SpO2 08/09/17 1400 98 %     Weight 08/09/17 1400 290 lb (131.5 kg)     Height 08/09/17 1400  (1.651 m)     Head Circumference --      Peak Flow --      Pain Score 08/09/17 1359 2     Pain Loc --      Pain Edu? --      Excl. in GC? --     Constitutional: Alert and oriented. Well appearing and in no acute distress. Eyes: Conjunctivae are normal.  Head: Atraumatic. Nose: No congestion/rhinnorhea. Mouth/Throat: Mucous membranes are moist.  Throat is normal Cardiovascular: Normal rate, regular rhythm.  Heart sounds are normal Respiratory: Normal respiratory effort.  No retractions, lungs are clear to auscultation, there is some decreased air movement in the lower lung fields, cough is dry and hacking GU: deferred Musculoskeletal: FROM all extremities, warm and well perfused Neurologic:  Normal  speech and language.  Skin:  Skin is warm, dry and intact. No rash noted. Psychiatric: Mood and affect are normal. Speech and behavior are normal.  ____________________________________________   LABS (all labs ordered are listed, but only abnormal results are displayed)  Labs Reviewed - No data to display ____________________________________________   ____________________________________________  RADIOLOGY    ____________________________________________   PROCEDURES  Procedure(s) performed: DuoNeb  Procedures    ____________________________________________   INITIAL IMPRESSION / ASSESSMENT AND PLAN / ED COURSE  Pertinent labs & imaging results that were available  during my care of the patient were reviewed by me and considered in my medical decision making (see chart for details).  Patient is 22 year old female presents emergency department complaining of cough and congestion for 3 days.  She states she feels like she is having difficulty breathing.  She denies any chest pain.  She states her friend has pneumonia and she is afraid she has the same.  She denies fever or chills.  On physical exam the patient appears well.  She does have a dry hacking cough.  Lungs are clear to auscultation but do have some decreased air movement in the lower lung fields.  Heart sounds are normal.  After the DuoNeb the patient has increased air movement in the lower lungs.  Explained the exam findings to the patient.  Diagnosis is acute bronchitis.  She is to follow-up with her regular doctor if not better in 3 to 5 days.  She is given prescription for Z-Pak and steroid pack.  Take over-the-counter cough medicines as needed.  Return emergency department if worsening.  She states she understands will comply with our recommendations.  She is discharged in stable condition     As part of my medical decision making, I reviewed the following data within the electronic MEDICAL RECORD NUMBER Nursing notes reviewed and incorporated, Old chart reviewed, Notes from prior ED visits and [place] Controlled Substance Database  ____________________________________________   FINAL CLINICAL IMPRESSION(S) / ED DIAGNOSES  Final diagnoses:  Acute bronchitis, unspecified organism      NEW MEDICATIONS STARTED DURING THIS VISIT:  New Prescriptions   AZITHROMYCIN (ZITHROMAX Z-PAK) 250 MG TABLET    2 pills today then 1 pill a day for 4 days   PREDNISONE (STERAPRED UNI-PAK 21 TAB) 10 MG (21) TBPK TABLET    Take 6 pills on day one then decrease by 1 pill each day     Note:  This document was prepared using Dragon voice recognition software and may include unintentional dictation errors.     Faythe Ghee, PA-C 08/09/17 1510    Nita Sickle, MD 08/12/17 [phone]

## 2017-08-09 NOTE — ED Triage Notes (Signed)
Presents with a 3 day hx of cough which is occasionally prod  Denies any fever

## 2017-08-09 NOTE — Discharge Instructions (Addendum)
See your Regular doctor if not better in 3 days.  Use medication as prescribed.  Return emergency department if worsening.

## 2018-03-16 ENCOUNTER — Ambulatory Visit
Admission: EM | Admit: 2018-03-16 | Discharge: 2018-03-16 | Disposition: A | Discharge status: Home / Self Care | Payer: 59 | Attending: Family Medicine | Admitting: Family Medicine

## 2018-03-16 ENCOUNTER — Other Ambulatory Visit: Payer: Self-pay

## 2018-03-16 DIAGNOSIS — G5702 Lesion of sciatic nerve, left lower limb: Secondary | ICD-10-CM | POA: Diagnosis not present

## 2018-03-16 MED ORDER — MELOXICAM 15 MG PO TABS: 15.0000 mg | ORAL_TABLET | Freq: Every day | ORAL | 0 refills | Status: DC | PRN

## 2018-03-16 MED ORDER — KETOROLAC TROMETHAMINE 60 MG/2ML IM SOLN
60.0000 mg | Freq: Once | INTRAMUSCULAR | Status: AC
  Administered 2018-03-16: 60 mg via INTRAMUSCULAR

## 2018-03-16 MED ORDER — TIZANIDINE HCL 4 MG PO TABS: 4.0000 mg | ORAL_TABLET | Freq: Four times a day (QID) | ORAL | 0 refills | Status: DC | PRN

## 2018-03-16 NOTE — ED Provider Notes (Signed)
[facility]    CSN: [id number] Arrival date & time: 03/16/18  1244  History   Chief Complaint Chief Complaint  Patient presents with  . Hip Pain   HPI   22 year old female with a history of lumbar fracture and subsequent fusion with later removal of her hardware presents with "hip pain".  Patient reports a 2 to 3-week history of left hip pain.  Patient localizes the pain to the piriformis region of her left buttock.  Patient states that it is worse when she sits.  Patient states that her job requires her to sit for prolonged periods of time.  Has been worse over the past 3 days.  She states it improves when she stands up.  No medications or interventions tried.  No radicular symptoms.  No other associated symptoms.  No other complaints.  Hx reviewed as below. Past Medical History:  Diagnosis Date  . PCOS (polycystic ovarian syndrome)    Past Surgical History:  Procedure Laterality Date  . ANKLE SURGERY    . BACK SURGERY     OB History    Gravida  0   Para  0   Term  0   Preterm  0   AB  0   Living  0     SAB  0   TAB  0   Ectopic  0   Multiple  0   Live Births             Home Medications    Prior to Admission medications   Medication Sig Start Date End Date Taking? Authorizing Provider  levonorgestrel-ethinyl estradiol (AVIANE,ALESSE,LESSINA) 0.1-20 MG-MCG tablet Take by mouth. 10/25/17 10/25/18 Yes [provider]  metFORMIN (GLUCOPHAGE) 500 MG tablet Take by mouth. 10/25/17 10/25/18 Yes [provider]  topiramate (TOPAMAX) 100 MG tablet Take by mouth. 10/25/17 10/25/18 Yes [provider]  gabapentin (NEURONTIN) 100 MG capsule Take 1 capsule (100 mg total) by mouth at bedtime. 04/03/17 05/03/17  Menshew, Charlesetta IvoryJenise V Bacon, PA-C  meloxicam (MOBIC) 15 MG tablet Take 1 tablet (15 mg total) by mouth daily as needed. 03/16/18   Tommie Samsook, Finesse Fielder G, DO  tiZANidine (ZANAFLEX) 4 MG tablet Take 1 tablet (4 mg total) by mouth every 6  (six) hours as needed for muscle spasms. 03/16/18   Tommie Samsook, Calyb Mcquarrie G, DO   Social History Social History   Tobacco Use  . Smoking status: Never Smoker  . Smokeless tobacco: Never Used  Substance Use Topics  . Alcohol use: No  . Drug use: No   Allergies   Patient has no known allergies.   Review of Systems Review of Systems  Constitutional: Negative.   Musculoskeletal:       Pain - buttock.   Physical Exam Triage Vital Signs ED Triage Vitals  Enc Vitals Group     BP 03/16/18 1253 135/89     Pulse Rate 03/16/18 1253 100     Resp 03/16/18 1253 18     Temp 03/16/18 1253 98.6 F (37 C)     Temp Source 03/16/18 1253 Oral     SpO2 03/16/18 1253 100 %     Weight 03/16/18 1254 284 lb (128.8 kg)     Height 03/16/18 1254 5\' 6"  (1.676 m)     Head Circumference --      Peak Flow --      Pain Score 03/16/18 1253 3     Pain Loc --      Pain Edu? --  Excl. in GC? --    Updated Vital Signs BP 135/89 (BP Location: Left Arm)   Pulse 100   Temp 98.6 F (37 C) (Oral)   Resp 18   Ht 5\' 6"  (1.676 m)   Wt 128.8 kg   LMP 03/02/2018   SpO2 100%   BMI 45.84 kg/m   Visual Acuity Right Eye Distance:   Left Eye Distance:   Bilateral Distance:    Right Eye Near:   Left Eye Near:    Bilateral Near:     Physical Exam Vitals signs and nursing note reviewed.  Constitutional:      General: She is not in acute distress.    Appearance: Normal appearance.  HENT:     Head: Normocephalic and atraumatic.     Nose: Nose normal.  Eyes:     Conjunctiva/sclera: Conjunctivae normal.  Cardiovascular:     Rate and Rhythm: Normal rate and regular rhythm.  Pulmonary:     Effort: Pulmonary effort is normal.     Breath sounds: No wheezing, rhonchi or rales.  Musculoskeletal:     Comments: Patient with tenderness of the left piriformis muscle.   Neurological:     Mental Status: She is alert.  Psychiatric:        Mood and Affect: Mood normal.        Behavior: Behavior normal.    UC  Treatments / Results  Labs (all labs ordered are listed, but only abnormal results are displayed) Labs Reviewed - No data to display  EKG None  Radiology No results found.  Procedures Procedures (including critical care time)  Medications Ordered in UC Medications  ketorolac (TORADOL) injection 60 mg (60 mg Intramuscular Given 03/16/18 1333)    Initial Impression / Assessment and Plan / UC Course  I have reviewed the triage vital signs and the nursing notes.  Pertinent labs & imaging results that were available during my care of the patient were reviewed by me and considered in my medical decision making (see chart for details).    22 year old female presents with piriformis syndrome.  Advised exercises.  Exercises given.  Meloxicam and Zanaflex.  Toradol given today.  Final Clinical Impressions(s) / UC Diagnoses   Final diagnoses:  Piriformis syndrome of left side     Discharge Instructions     Exercises.  Medication as prescribed.  Take care  Dr. Adriana Simasook    ED Prescriptions    Medication Sig Dispense Auth. Provider   meloxicam (MOBIC) 15 MG tablet Take 1 tablet (15 mg total) by mouth daily as needed. 30 tablet Lillyona Polasek G, DO   tiZANidine (ZANAFLEX) 4 MG tablet Take 1 tablet (4 mg total) by mouth every 6 (six) hours as needed for muscle spasms. 30 tablet Tommie Samsook, Susette Seminara G, DO     Controlled Substance Prescriptions [place] Controlled Substance Registry consulted? Not Applicable   Tommie SamsCook, Vinaya Sancho G, DO 03/16/18 1645

## 2018-03-16 NOTE — Discharge Instructions (Signed)
Exercises.  Medication as prescribed.  Take care  Dr. Adriana Simasook

## 2018-03-16 NOTE — ED Triage Notes (Signed)
3 weeks of left hip pain, intermittent and much worse with sitting. Last 3 days pain has been more constant. Denies radiation

## 2018-04-08 ENCOUNTER — Other Ambulatory Visit: Payer: Self-pay | Admitting: Family Medicine

## 2018-04-09 ENCOUNTER — Other Ambulatory Visit: Payer: Self-pay

## 2018-04-09 ENCOUNTER — Encounter: Payer: Self-pay | Admitting: Emergency Medicine

## 2018-04-09 ENCOUNTER — Ambulatory Visit
Admission: EM | Admit: 2018-04-09 | Discharge: 2018-04-09 | Disposition: A | Discharge status: Home / Self Care | Payer: 59 | Attending: Family Medicine | Admitting: Family Medicine

## 2018-04-09 DIAGNOSIS — J029 Acute pharyngitis, unspecified: Secondary | ICD-10-CM | POA: Diagnosis not present

## 2018-04-09 DIAGNOSIS — M7918 Myalgia, other site: Secondary | ICD-10-CM

## 2018-04-09 DIAGNOSIS — J101 Influenza due to other identified influenza virus with other respiratory manifestations: Secondary | ICD-10-CM

## 2018-04-09 DIAGNOSIS — R509 Fever, unspecified: Secondary | ICD-10-CM

## 2018-04-09 DIAGNOSIS — J111 Influenza due to unidentified influenza virus with other respiratory manifestations: Secondary | ICD-10-CM

## 2018-04-09 DIAGNOSIS — R05 Cough: Secondary | ICD-10-CM

## 2018-04-09 DIAGNOSIS — R0981 Nasal congestion: Secondary | ICD-10-CM | POA: Diagnosis not present

## 2018-04-09 DIAGNOSIS — R69 Illness, unspecified: Secondary | ICD-10-CM | POA: Insufficient documentation

## 2018-04-09 LAB — RAPID INFLUENZA A&B ANTIGENS (ARMC ONLY): INFLUENZA B (ARMC): POSITIVE — AB

## 2018-04-09 LAB — RAPID INFLUENZA A&B ANTIGENS: Influenza A (ARMC): NEGATIVE

## 2018-04-09 MED ORDER — OSELTAMIVIR PHOSPHATE 75 MG PO CAPS: 75.0000 mg | ORAL_CAPSULE | Freq: Two times a day (BID) | ORAL | 0 refills | Status: DC

## 2018-04-09 NOTE — ED Provider Notes (Signed)
[facility] ____________________________________________  Time seen: Approximately 9:26 AM  I have reviewed the triage vital signs and the nursing notes.   HISTORY  Chief Complaint Cough and Fever   HPI Christine Oconnor is a 23 y.o. female presenting for evaluation of cough, nasal congestion, chills, body aches and fever.  Reports symptoms started later on Saturday.  States on Saturday she had some vomiting that extended until yesterday.  Also with accompanying diarrhea.  Denies any current abdominal pain or continued vomiting.  Occasional diarrhea still.  States sore throat only from coughing, denies other sore throat.  Reports T-max yesterday morning 102.  Has been taken over-the-counter Tylenol and ibuprofen.  Last Tylenol was around 4 AM this morning.  Denies known direct sick contacts.  Denies chest pain, shortness of breath or other complaints.  Denies recent sickness.  Denies current pregnancy.  [place], [place] [facility]: PCP    Past Medical History:  Diagnosis Date  . PCOS (polycystic ovarian syndrome)     There are no active problems to display for this patient.   Past Surgical History:  Procedure Laterality Date  . ANKLE SURGERY    . BACK SURGERY       No current facility-administered medications for this encounter.   Current Outpatient Medications:  .  levonorgestrel-ethinyl estradiol (AVIANE,ALESSE,LESSINA) 0.1-20 MG-MCG tablet, Take by mouth., Disp: , Rfl:  .  metFORMIN (GLUCOPHAGE) 500 MG tablet, Take by mouth., Disp: , Rfl:  .  topiramate (TOPAMAX) 100 MG tablet, Take by mouth., Disp: , Rfl:  .  gabapentin (NEURONTIN) 100 MG capsule, Take 1 capsule (100 mg total) by mouth at bedtime., Disp: 30 capsule, Rfl: 0 .  oseltamivir (TAMIFLU) 75 MG capsule, Take 1 capsule (75 mg total) by mouth every 12 (twelve) hours., Disp: 10 capsule, Rfl: 0  Allergies Patient has no known allergies.  History reviewed. No pertinent family history.  Social  History Social History   Tobacco Use  . Smoking status: Never Smoker  . Smokeless tobacco: Never Used  Substance Use Topics  . Alcohol use: No  . Drug use: No    Review of Systems Constitutional: No fever ENT: as above.  Bilateral ear discomfort. Cardiovascular: Denies chest pain. Respiratory: Denies shortness of breath. Gastrointestinal: No abdominal pain.  As above.  Musculoskeletal: Negative for back pain. Skin: Negative for rash.  ____________________________________________   PHYSICAL EXAM:  VITAL SIGNS: ED Triage Vitals  Enc Vitals Group     BP 04/09/18 0901 (!) 119/92     Pulse Rate 04/09/18 0901 93     Resp 04/09/18 0901 18     Temp 04/09/18 0901 99.2 F (37.3 C)     Temp Source 04/09/18 0901 Oral     SpO2 04/09/18 0901 97 %     Weight 04/09/18 0859 280 lb (127 kg)     Height 04/09/18 0859 5\' 6"  (1.676 m)     Head Circumference --      Peak Flow --      Pain Score 04/09/18 0859 8     Pain Loc --      Pain Edu? --      Excl. in GC? --     Constitutional: Alert and oriented. Well appearing and in no acute distress. Eyes: Conjunctivae are normal.  Head: Atraumatic. No sinus tenderness to palpation. No swelling. No erythema.  Ears: no erythema, normal TMs bilaterally.   Nose:Nasal congestion   Mouth/Throat: Mucous membranes are moist. No pharyngeal erythema. No tonsillar swelling  or exudate.  Neck: No stridor.  No cervical spine tenderness to palpation. Hematological/Lymphatic/Immunilogical: No cervical lymphadenopathy. Cardiovascular: Normal rate, regular rhythm. Grossly normal heart sounds.  Good peripheral circulation. Respiratory: Normal respiratory effort.  No retractions. No wheezes, rales or rhonchi. Good air movement.  Gastrointestinal: Soft and nontender. Normal Bowel sounds. No CVA tenderness. Musculoskeletal: Ambulatory with steady gait. No cervical, thoracic or lumbar tenderness to palpation. Neurologic:  Normal speech and language. No gait  instability. Skin:  Skin appears warm, dry and intact. No rash noted. Psychiatric: Mood and affect are normal. Speech and behavior are normal. ___________________________________________   LABS (all labs ordered are listed, but only abnormal results are displayed)  Labs Reviewed  RAPID INFLUENZA A&B ANTIGENS ([facility] ONLY) - Abnormal; Notable for the following components:      Result Value   Influenza B ([facility]) POSITIVE (*)    All other components within normal limits     PROCEDURES Procedures    INITIAL IMPRESSION / ASSESSMENT AND PLAN / ED COURSE  Pertinent labs & imaging results that were available during my care of the patient were reviewed by me and considered in my medical decision making (see chart for details).  Well-appearing patient.  No acute distress.  Suspect influenza.  Influenza B positive.  Will treat with Tamiflu.  Encourage rest, fluids, supportive care.Discussed indication, risks and benefits of medications with patient.  Discussed follow up with Primary care physician this week. Discussed follow up and return parameters including no resolution or any worsening concerns. Patient verbalized understanding and agreed to plan.   ____________________________________________   FINAL CLINICAL IMPRESSION(S) / ED DIAGNOSES  Final diagnoses:  Influenza-like illness     ED Discharge Orders         Ordered    oseltamivir (TAMIFLU) 75 MG capsule  Every 12 hours     04/09/18 5176           Note: This dictation was prepared with Dragon dictation along with smaller phrase technology. Any transcriptional errors that result from this process are unintentional.         Renford Dills, NP 04/09/18 1006

## 2018-04-09 NOTE — ED Triage Notes (Signed)
Patient c/o ear pain, fever and cough x 2 days.

## 2018-04-09 NOTE — Discharge Instructions (Signed)
Take medication as prescribed. Rest. Drink plenty of fluids.  Alternate over-the-counter Tylenol and ibuprofen as discussed.  Follow up with your primary care physician this week as needed. Return to Urgent care for new or worsening concerns.      Procedures

## 2019-01-09 ENCOUNTER — Other Ambulatory Visit: Payer: Self-pay

## 2019-01-09 DIAGNOSIS — Z20822 Contact with and (suspected) exposure to covid-19: Secondary | ICD-10-CM

## 2019-01-11 LAB — NOVEL CORONAVIRUS, NAA: SARS-CoV-2, NAA: NOT DETECTED

## 2019-06-20 ENCOUNTER — Other Ambulatory Visit: Payer: Self-pay

## 2019-06-20 ENCOUNTER — Ambulatory Visit
Admission: EM | Admit: 2019-06-20 | Discharge: 2019-06-20 | Disposition: A | Discharge status: Home / Self Care | Payer: BC Managed Care – PPO

## 2019-08-19 ENCOUNTER — Other Ambulatory Visit: Payer: Self-pay

## 2019-08-19 ENCOUNTER — Encounter: Payer: Self-pay | Admitting: Emergency Medicine

## 2019-08-19 ENCOUNTER — Emergency Department
Admission: EM | Admit: 2019-08-19 | Discharge: 2019-08-19 | Disposition: A | Discharge status: Home / Self Care | Payer: BC Managed Care – PPO | Attending: Emergency Medicine | Admitting: Emergency Medicine

## 2019-08-19 DIAGNOSIS — N939 Abnormal uterine and vaginal bleeding, unspecified: Secondary | ICD-10-CM | POA: Insufficient documentation

## 2019-08-19 DIAGNOSIS — Z202 Contact with and (suspected) exposure to infections with a predominantly sexual mode of transmission: Secondary | ICD-10-CM

## 2019-08-19 DIAGNOSIS — A549 Gonococcal infection, unspecified: Secondary | ICD-10-CM | POA: Insufficient documentation

## 2019-08-19 LAB — URINALYSIS, COMPLETE (UACMP) WITH MICROSCOPIC
Bacteria, UA: NONE SEEN
Bilirubin Urine: NEGATIVE
Glucose, UA: NEGATIVE mg/dL
Ketones, ur: NEGATIVE mg/dL
Leukocytes,Ua: NEGATIVE
Nitrite: NEGATIVE
Protein, ur: 100 mg/dL — AB
RBC / HPF: >50 RBC/hpf — ABNORMAL HIGH (ref 0–5)
Specific Gravity, Urine: 1.03 (ref 1.005–1.030)
pH: 5 (ref 5.0–8.0)

## 2019-08-19 LAB — CHLAMYDIA/NGC RT PCR (ARMC ONLY)
Chlamydia Tr: NOT DETECTED
N gonorrhoeae: DETECTED — AB

## 2019-08-19 LAB — WET PREP, GENITAL
Clue Cells Wet Prep HPF POC: NONE SEEN
Sperm: NONE SEEN
Trich, Wet Prep: NONE SEEN
Yeast Wet Prep HPF POC: NONE SEEN

## 2019-08-19 MED ORDER — CEFTRIAXONE SODIUM 1 G IJ SOLR
500.0000 mg | Freq: Once | INTRAMUSCULAR | Status: AC
  Administered 2019-08-19: 500 mg via INTRAMUSCULAR
  Filled 2019-08-19: qty 10

## 2019-08-19 MED ORDER — AZITHROMYCIN 1 G PO PACK
1.0000 g | PACK | Freq: Once | ORAL | Status: AC
  Administered 2019-08-19: 1 g via ORAL
  Filled 2019-08-19: qty 1

## 2019-08-19 NOTE — Discharge Instructions (Addendum)
You are being treated for a STI exposure.Follow-up with [facility] Department or your provider as needed. Avoid any sexual contact until you and your partner have been treated, and 1 week has passed.

## 2019-08-19 NOTE — ED Notes (Signed)
Pt from home. Her boyfriend is also here in another room, both are concerned for STD. Pt states she has pressure in lower abdomen, no pain upon urination. Denies any other symptoms. Pt alert & oriented, nad noted.

## 2019-08-19 NOTE — ED Triage Notes (Signed)
Possible STD exposure.  Paitent's boyfriend in ED being seen for same

## 2019-08-20 ENCOUNTER — Telehealth: Payer: Self-pay | Admitting: Emergency Medicine

## 2019-08-20 NOTE — ED Provider Notes (Signed)
[facility] Emergency Department Provider Note ____________________________________________  Time seen: 40  I have reviewed the triage vital signs and the nursing notes.  HISTORY  Chief Complaint  Exposure to STD  HPI Christine Oconnor is a 24 y.o. female presents to the ED accompanied by her boyfriend, who is also being evaluated for similar symptoms.  Patient describes exposure to STD after she and her boyfriend broke up for several days.  She admits to an unprotected encounter with him, after he admitted to having an unprotected encounter  with a new partner.  The patient denies any vaginal discharge, irritation, pelvic pain, or fevers.  Starting her menses today denies any concern.  Past Medical History:  Diagnosis Date  . PCOS (polycystic ovarian syndrome)     There are no problems to display for this patient.   Past Surgical History:  Procedure Laterality Date  . ANKLE SURGERY    . BACK SURGERY      Prior to Admission medications   Medication Sig Start Date End Date Taking? Authorizing Provider  gabapentin (NEURONTIN) 100 MG capsule Take 1 capsule (100 mg total) by mouth at bedtime. 04/03/17 05/03/17  Eduarda Scrivens, Charlesetta Ivory, PA-C  levonorgestrel-ethinyl estradiol (AVIANE,ALESSE,LESSINA) 0.1-20 MG-MCG tablet Take by mouth. 10/25/17 10/25/18  [provider]  metFORMIN (GLUCOPHAGE) 500 MG tablet Take by mouth. 10/25/17 10/25/18  [provider]  oseltamivir (TAMIFLU) 75 MG capsule Take 1 capsule (75 mg total) by mouth every 12 (twelve) hours. 04/09/18   Renford Dills, NP  topiramate (TOPAMAX) 100 MG tablet Take by mouth. 10/25/17 10/25/18  [provider]    Allergies Patient has no known allergies.  No family history on file.  Social History Social History   Tobacco Use  . Smoking status: Never Smoker  . Smokeless tobacco: Never Used  Substance Use Topics  . Alcohol use: No  . Drug use: No    Review of  Systems  Constitutional: Negative for fever. Eyes: Negative for visual changes. ENT: Negative for sore throat. Cardiovascular: Negative for chest pain. Respiratory: Negative for shortness of breath. Gastrointestinal: Negative for abdominal pain, vomiting and diarrhea. Genitourinary: Negative for dysuria or vaginal discharge Skin: Negative for rash. ____________________________________________  PHYSICAL EXAM:  VITAL SIGNS: ED Triage Vitals  Enc Vitals Group     BP 08/19/19 1752 111/79     Pulse Rate 08/19/19 1752 65     Resp 08/19/19 1752 16     Temp 08/19/19 1752 98.3 F (36.8 C)     Temp Source 08/19/19 1752 Oral     SpO2 08/19/19 1752 98 %     Weight 08/19/19 1751 279 lb 15.8 oz (127 kg)     Height 08/19/19 1751 5\' 6"  (1.676 m)     Head Circumference --      Peak Flow --      Pain Score 08/19/19 1751 0     Pain Loc --      Pain Edu? --      Excl. in GC? --     Constitutional: Alert and oriented. Well appearing and in no distress. Head: Normocephalic and atraumatic. Eyes: Conjunctivae are normal. Normal extraocular movements Cardiovascular: Normal rate, regular rhythm. Normal distal pulses. Respiratory: Normal respiratory effort.  GU: normal external genitalia. Dark blood in the vault.  Musculoskeletal: Nontender with normal range of motion in all extremities.  Neurologic:  Normal gait without ataxia. Normal speech and language. No gross focal neurologic deficits are appreciated. Skin:  Skin is  warm, dry and intact. No rash noted. ____________________________________________   LABS (pertinent positives/negatives)  Labs Reviewed  CHLAMYDIA/NGC RT PCR ([facility] ONLY) - Abnormal; Notable for the following components:      Result Value   N gonorrhoeae DETECTED (*)    All other components within normal limits  WET PREP, GENITAL - Abnormal; Notable for the following components:   WBC, Wet Prep HPF POC FEW (*)    All other components within normal limits   URINALYSIS, COMPLETE (UACMP) WITH MICROSCOPIC - Abnormal; Notable for the following components:   Color, Urine AMBER (*)    APPearance TURBID (*)    Hgb urine dipstick LARGE (*)    Protein, ur 100 (*)    RBC / HPF >50 (*)    All other components within normal limits  ___________________________________________  PROCEDURES  Ceftriaxone 500 mg IM Azithromycin 1 g PO  Procedures ____________________________________________  INITIAL IMPRESSION / ASSESSMENT AND PLAN / ED COURSE  Patient with ED evaluation and concern for STD exposure.  Her swab returned prior to discharge and after empiric treatment is positive for gonorrhea.  Patient has been treated appropriately and is discharged at this time.  She is advised to avoid any sexual contact for the next 7 days and until symptoms are completely resolved.  Christine Oconnor was evaluated in Emergency Department on 08/20/2019 for the symptoms described in the history of present illness. She was evaluated in the context of the global COVID-19 pandemic, which necessitated consideration that the patient might be at risk for infection with the SARS-CoV-2 virus that causes COVID-19. Institutional protocols and algorithms that pertain to the evaluation of patients at risk for COVID-19 are in a state of rapid change based on information released by regulatory bodies including the CDC and federal and state organizations. These policies and algorithms were followed during the patient's care in the ED. ____________________________________________  FINAL CLINICAL IMPRESSION(S) / ED DIAGNOSES  Final diagnoses:  Possible exposure to STD  Gonorrhea in female      Melvenia Needles, PA-C 08/20/19 2242    Blake Divine, MD 08/20/19 [phone]

## 2019-08-20 NOTE — Telephone Encounter (Signed)
Called patient to assure she is aware of std results.  She is aware as she looked at her mychart.  She has been treated during visit.

## 2019-10-12 IMAGING — CR DG LUMBAR SPINE COMPLETE 4+V
6 series · 6 of 6 positions shown · non-contrast
Comparison: Radiographs 12/26/2014

CLINICAL DATA: Lumbosacral back pain for 1 day. Pain radiates to
both thighs. No known injury. Prior L3 compression fracture status
post L2-L4 fusion. Hardware removed [DATE] due to screw failure.

EXAM:
LUMBAR SPINE - COMPLETE 4+ VIEW

[l-spine ap (1 of 2)]
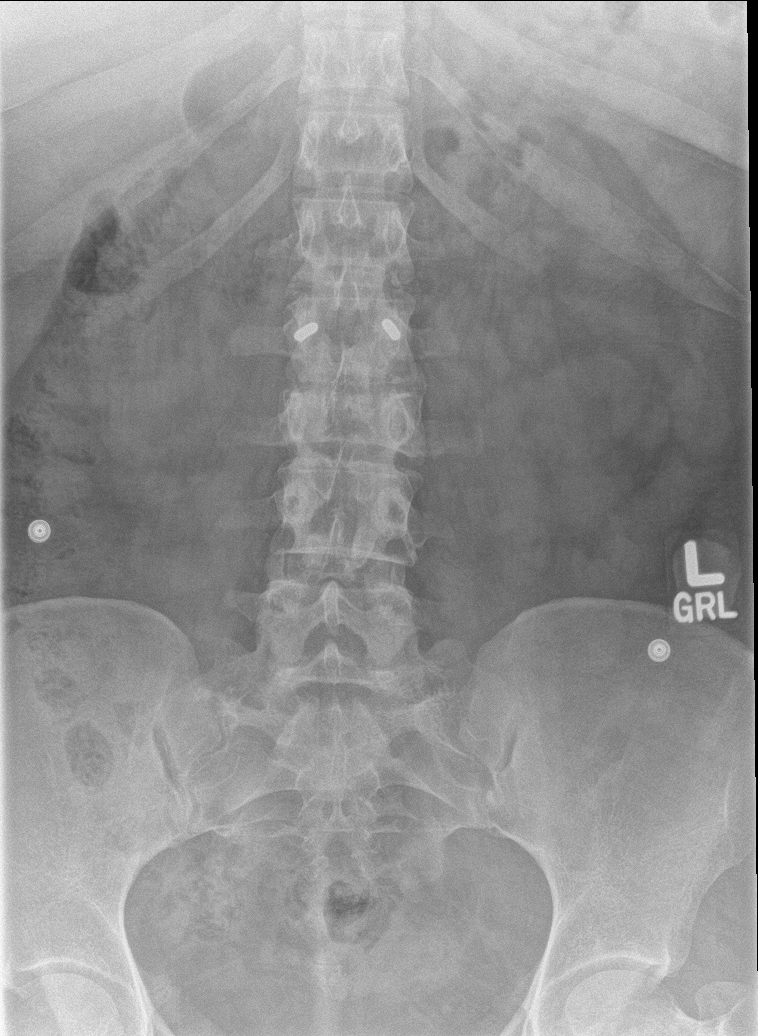

[l-spine obl (1 of 2)]
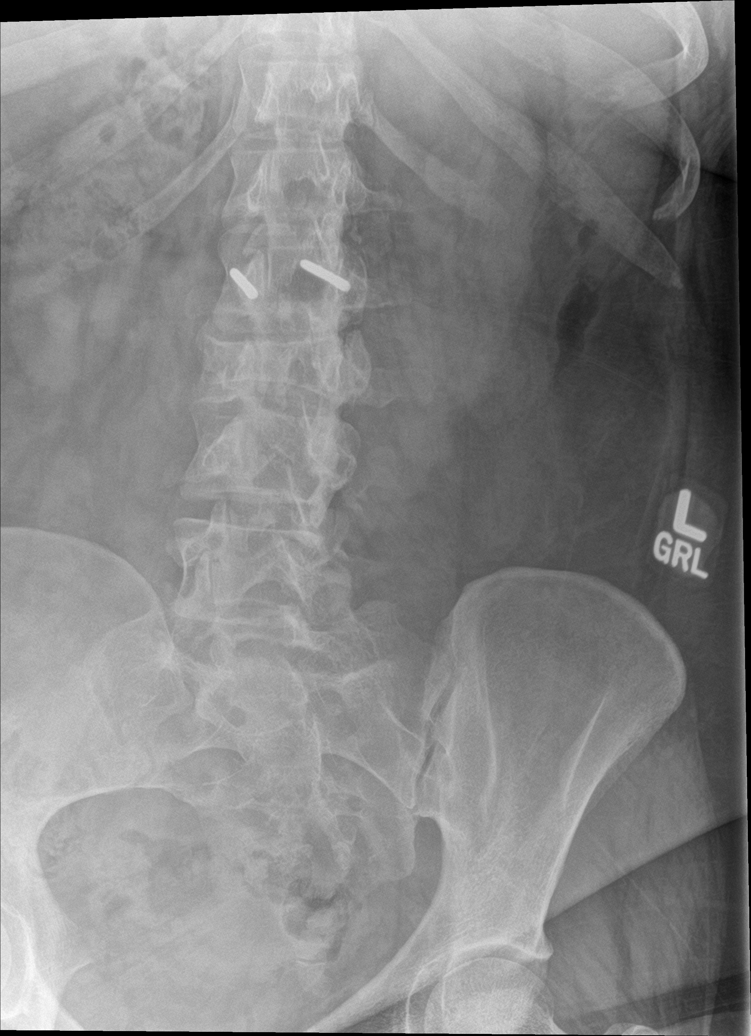

[l-spine obl (2 of 2)]
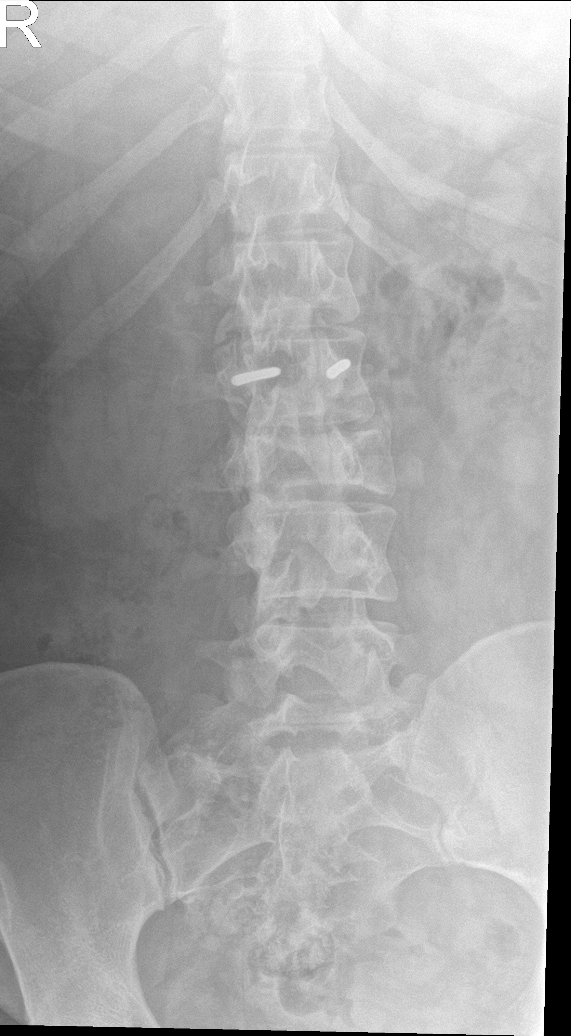

[l-spine lat]
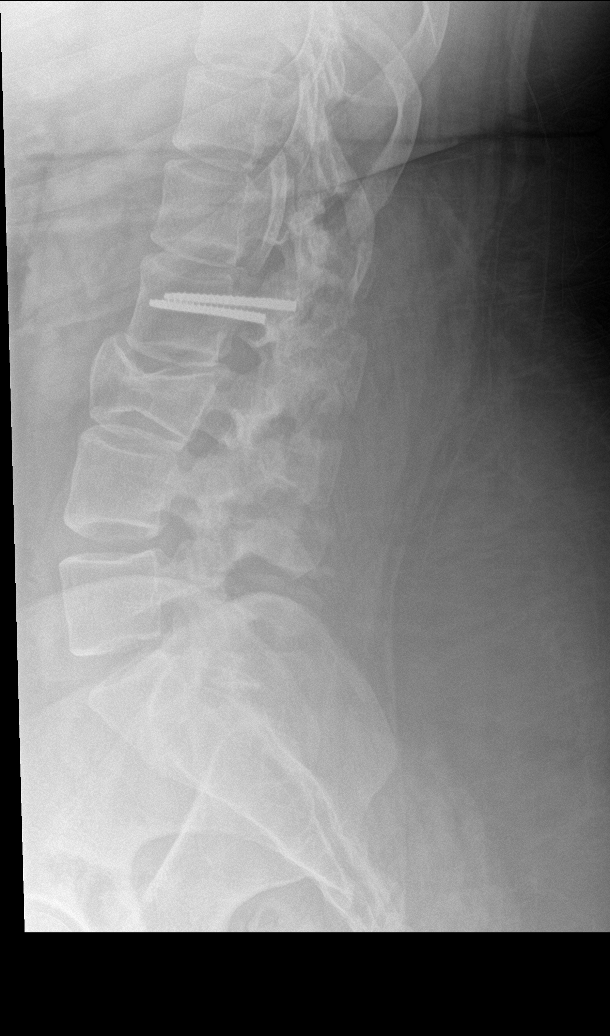

[l-spine spot]
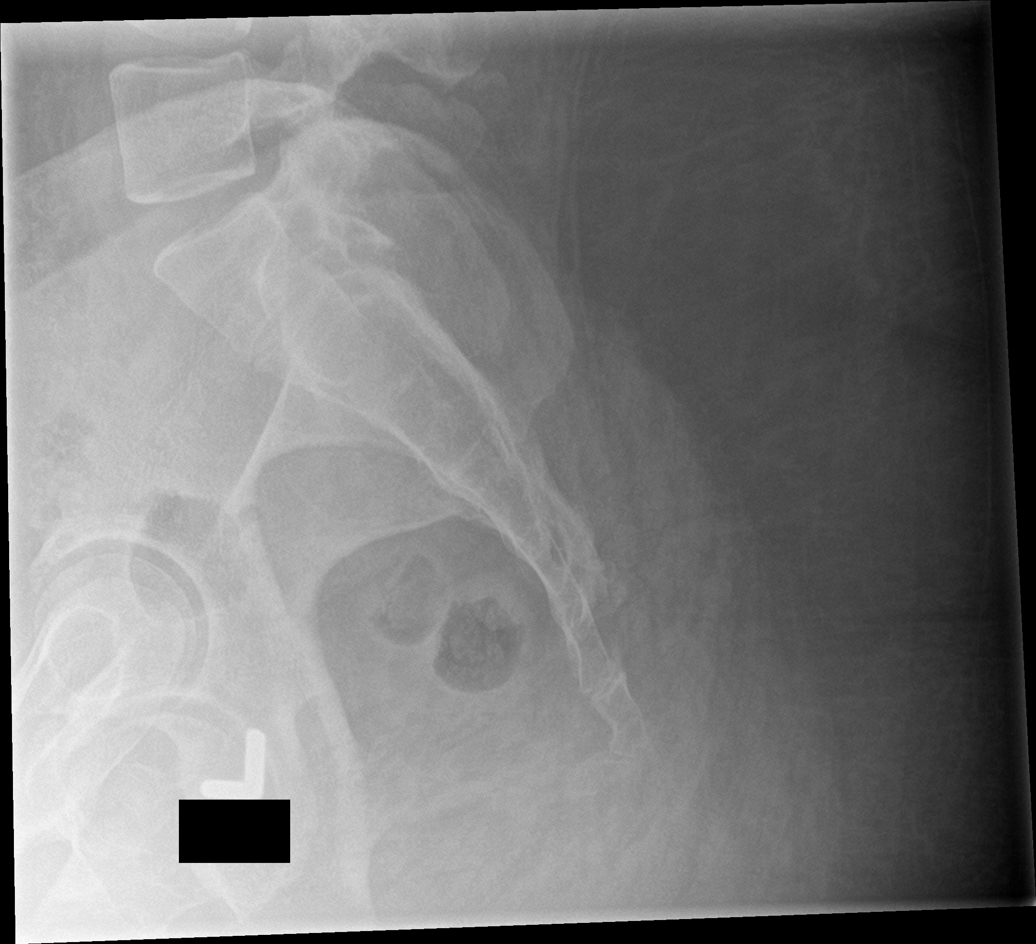

[l-spine ap (2 of 2)]
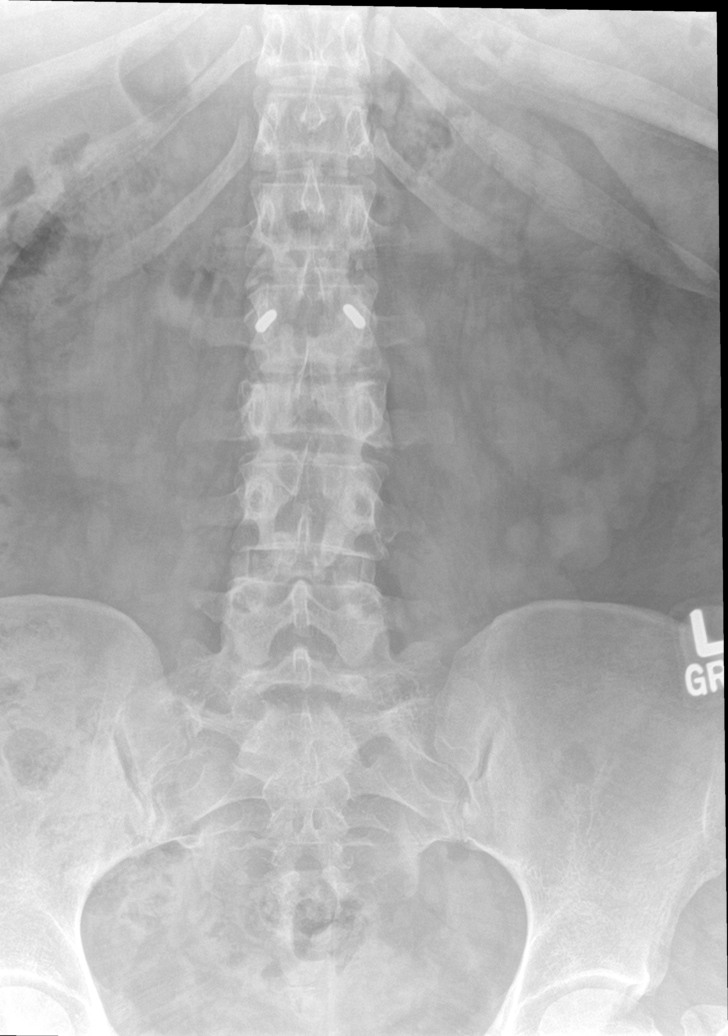

[6 of 6 positions shown; findings below may reference images not displayed]

FINDINGS: Chronic L3 compression fracture unchanged in appearance from prior
exam. Alignment is unchanged. Removal of posterior rods and L4
intrapedicular screws. The intrapedicular screws within L2 are
remain in situ. No definite peri-screw lucency. No new fracture.
Other vertebral body heights are preserved. The sacroiliac joints
are congruent.
IMPRESSION: 1. No acute abnormality.
2. Remote L3 compression fracture unchanged in alignment. Majority
of the prior posterior fusion hardware has been removed, the L2
pedicle screws remain in situ.

## 2020-02-25 ENCOUNTER — Ambulatory Visit (LOCAL_COMMUNITY_HEALTH_CENTER): Payer: Medicaid Other

## 2020-02-25 ENCOUNTER — Other Ambulatory Visit: Payer: Self-pay

## 2020-02-25 VITALS — BP 112/79 | Ht 65.0 in | Wt 261.5 lb

## 2020-02-25 DIAGNOSIS — Z3201 Encounter for pregnancy test, result positive: Secondary | ICD-10-CM | POA: Diagnosis not present

## 2020-02-25 LAB — PREGNANCY, URINE: Preg Test, Ur: POSITIVE — AB

## 2020-02-25 MED ORDER — PRENATAL 27-0.8 MG PO TABS: 1.0000 | ORAL_TABLET | Freq: Every day | ORAL | 0 refills | Status: AC

## 2020-02-25 NOTE — Progress Notes (Signed)
UPT positive. Plans prenatal care at either [place] or [place]. Plans to contact Medicaid worker re: current pregnancy status. Jerel Shepherd, RN

## 2020-03-06 DIAGNOSIS — O0990 Supervision of high risk pregnancy, unspecified, unspecified trimester: Secondary | ICD-10-CM | POA: Insufficient documentation

## 2020-03-28 DIAGNOSIS — O24414 Gestational diabetes mellitus in pregnancy, insulin controlled: Secondary | ICD-10-CM

## 2020-03-28 HISTORY — DX: Gestational diabetes mellitus in pregnancy, insulin controlled: O24.414

## 2020-03-31 DIAGNOSIS — F32A Other mental disorders complicating pregnancy, first trimester: Secondary | ICD-10-CM | POA: Insufficient documentation

## 2020-03-31 DIAGNOSIS — E669 Obesity, unspecified: Secondary | ICD-10-CM | POA: Insufficient documentation

## 2020-03-31 DIAGNOSIS — Z7185 Encounter for immunization safety counseling: Secondary | ICD-10-CM | POA: Insufficient documentation

## 2020-04-14 DIAGNOSIS — Z9889 Other specified postprocedural states: Secondary | ICD-10-CM | POA: Insufficient documentation

## 2020-07-14 DIAGNOSIS — O2602 Excessive weight gain in pregnancy, second trimester: Secondary | ICD-10-CM | POA: Insufficient documentation

## 2020-07-20 ENCOUNTER — Other Ambulatory Visit: Payer: Self-pay

## 2020-07-20 ENCOUNTER — Ambulatory Visit
Admission: EM | Admit: 2020-07-20 | Discharge: 2020-07-20 | Disposition: A | Discharge status: Home / Self Care | Payer: Medicaid Other | Attending: Emergency Medicine | Admitting: Emergency Medicine

## 2020-07-20 ENCOUNTER — Encounter: Payer: Self-pay | Admitting: Emergency Medicine

## 2020-07-20 DIAGNOSIS — O26892 Other specified pregnancy related conditions, second trimester: Secondary | ICD-10-CM | POA: Insufficient documentation

## 2020-07-20 DIAGNOSIS — R519 Headache, unspecified: Secondary | ICD-10-CM | POA: Insufficient documentation

## 2020-07-20 DIAGNOSIS — B349 Viral infection, unspecified: Secondary | ICD-10-CM

## 2020-07-20 DIAGNOSIS — Z20822 Contact with and (suspected) exposure to covid-19: Secondary | ICD-10-CM | POA: Diagnosis not present

## 2020-07-20 DIAGNOSIS — H6691 Otitis media, unspecified, right ear: Secondary | ICD-10-CM

## 2020-07-20 DIAGNOSIS — O99891 Other specified diseases and conditions complicating pregnancy: Secondary | ICD-10-CM | POA: Diagnosis not present

## 2020-07-20 DIAGNOSIS — Z3A24 24 weeks gestation of pregnancy: Secondary | ICD-10-CM | POA: Diagnosis not present

## 2020-07-20 DIAGNOSIS — R0981 Nasal congestion: Secondary | ICD-10-CM | POA: Diagnosis present

## 2020-07-20 DIAGNOSIS — O98512 Other viral diseases complicating pregnancy, second trimester: Secondary | ICD-10-CM | POA: Insufficient documentation

## 2020-07-20 LAB — RESP PANEL BY RT-PCR (FLU A&B, COVID) ARPGX2
Influenza A by PCR: NEGATIVE
Influenza B by PCR: NEGATIVE
SARS Coronavirus 2 by RT PCR: NEGATIVE

## 2020-07-20 MED ORDER — AMOXICILLIN 875 MG PO TABS: 875.0000 mg | ORAL_TABLET | Freq: Two times a day (BID) | ORAL | 0 refills | Status: DC

## 2020-07-20 NOTE — ED Provider Notes (Signed)
[facility] ____________________________________________  Time seen: Approximately 12:30 PM  I have reviewed the triage vital signs and the nursing notes.   HISTORY  Chief Complaint Nasal Congestion and Headache   HPI Christine Oconnor is a 25 y.o. female is [redacted] weeks pregnant presenting for evaluation of 3 to 4 days of runny nose, nasal congestion.  Reports intermittent headache.  Has been having persisting right ear pain.  Intermittent sore throat.  Occasional cough.  Denies fevers, vomiting, diarrhea, chest pain, shortness of breath, abdominal pain.  Reports still feels baby move well.  Denies any vaginal bleeding, vaginal spotting or vaginal leaking.  Denies pregnancy complaints.  Did take some over-the-counter congestion medication, that she reports was recommended safe during pregnancy without resolution.  Denies other recent sickness.  Denies known sick contacts.  Past Medical History:  Diagnosis Date  . Anemia   . PCOS (polycystic ovarian syndrome)     There are no problems to display for this patient.   Past Surgical History:  Procedure Laterality Date  . ANKLE SURGERY    . BACK SURGERY       No current facility-administered medications for this encounter.  Current Outpatient Medications:  .  amoxicillin (AMOXIL) 875 MG tablet, Take 1 tablet (875 mg total) by mouth 2 (two) times daily., Disp: 20 tablet, Rfl: 0 .  gabapentin (NEURONTIN) 100 MG capsule, Take 1 capsule (100 mg total) by mouth at bedtime., Disp: 30 capsule, Rfl: 0 .  levonorgestrel-ethinyl estradiol (AVIANE,ALESSE,LESSINA) 0.1-20 MG-MCG tablet, Take by mouth., Disp: , Rfl:  .  metFORMIN (GLUCOPHAGE) 500 MG tablet, Take by mouth., Disp: , Rfl:  .  oseltamivir (TAMIFLU) 75 MG capsule, Take 1 capsule (75 mg total) by mouth every 12 (twelve) hours., Disp: 10 capsule, Rfl: 0 .  topiramate (TOPAMAX) 100 MG tablet, Take by mouth., Disp: , Rfl:   Allergies Patient has no known  allergies.  History reviewed. No pertinent family history.  Social History Social History   Tobacco Use  . Smoking status: Never Smoker  . Smokeless tobacco: Never Used  Substance Use Topics  . Alcohol use: Not Currently    Alcohol/week: 1.0 standard drink    Types: 1 Standard drinks or equivalent per week    Comment: last use2 weeks ago- vodka  . Drug use: Yes    Frequency: 7.0 times per week    Types: Marijuana    Comment: smokes marijuana daily- last use 02/22/20    Review of Systems Constitutional: No fever ENT: As above. Cardiovascular: Denies chest pain. Respiratory: Denies shortness of breath. Gastrointestinal: No abdominal pain.  No nausea, no vomiting.  No diarrhea.  No constipation. Genitourinary: Negative for dysuria. Musculoskeletal: Negative for back pain. Skin: Negative for rash.   ____________________________________________   PHYSICAL EXAM:  VITAL SIGNS: ED Triage Vitals  Enc Vitals Group     BP 07/20/20 1013 128/90     Pulse Rate 07/20/20 1013 (!) 112     Resp 07/20/20 1013 19     Temp 07/20/20 1013 98.1 F (36.7 C)     Temp Source 07/20/20 1013 Oral     SpO2 07/20/20 1013 100 %     Weight --      Height --      Head Circumference --      Peak Flow --      Pain Score 07/20/20 1010 7     Pain Loc --      Pain Edu? --  Excl. in GC? --   Heart rate recheck 92 bpm  Constitutional: Alert and oriented. Well appearing and in no acute distress. Eyes: Conjunctivae are normal. PERRL. EOMI. ENT      Head: Normocephalic and atraumatic.      Ears: Right: Nontender, normal canal, moderate erythema and bulging TM.  Left: Nontender, normal canal, no erythema, normal TM.      Nose: Nasal congestion      Mouth/Throat: Mucous membranes are moist.Oropharynx non-erythematous.  No tonsillar swelling or exudate. Neck: No stridor. Supple without meningismus.  Hematological/Lymphatic/Immunilogical: Mild right anterior cervical  lymphadenopathy. Cardiovascular: Normal rate, regular rhythm. Grossly normal heart sounds.  Good peripheral circulation. Respiratory: Normal respiratory effort without tachypnea nor retractions. Breath sounds are clear and equal bilaterally. No wheezes, rales, rhonchi. Musculoskeletal: Steady gait.  No lower extremity edema. Neurologic:  Normal speech and language.  Skin:  Skin is warm, dry and intact. No rash noted. Psychiatric: Mood and affect are normal. Speech and behavior are normal. Patient exhibits appropriate insight and judgment   ___________________________________________   LABS (all labs ordered are listed, but only abnormal results are displayed)  Labs Reviewed  RESP PANEL BY RT-PCR (FLU A&B, COVID) [id number]   ____________________________________________   PROCEDURES Procedures     INITIAL IMPRESSION / ASSESSMENT AND PLAN / ED COURSE  Pertinent labs & imaging results that were available during my care of the patient were reviewed by me and considered in my medical decision making (see chart for details).  Well-appearing patient.  No acute distress.  Patient did a home COVID test that was negative.  Will repeat COVID PCR as well as flu test.  Denies pregnancy complications.  Lungs clear throughout.  Overall well-appearing.  Right otalgia with right otitis noted.  Suspect viral illness with right otitis.  Will treat oral amoxicillin.  Discussed Tylenol and plain Robitussin as needed.  Follow-up with primary care or OB/GYN.  Supportive care.Discussed indication, risks and benefits of medications with patient.   Discussed follow up with Primary care physician this week. Discussed follow up and return parameters including no resolution or any worsening concerns. Patient verbalized understanding and agreed to plan.   ____________________________________________   FINAL CLINICAL IMPRESSION(S) / ED DIAGNOSES  Final diagnoses:  Viral illness  Right otitis media, unspecified  otitis media type  [redacted] weeks gestation of pregnancy     ED Discharge Orders         Ordered    amoxicillin (AMOXIL) 875 MG tablet  2 times daily        07/20/20 1106           Note: This dictation was prepared with Dragon dictation along with smaller phrase technology. Any transcriptional errors that result from this process are unintentional.         Renford Dills, NP 07/20/20 1233

## 2020-07-20 NOTE — ED Triage Notes (Signed)
Pt is present today with a sore throat, right ear pain, and slight cough. Pt states that her sx started Friday. Pt states that she self tested for covid at home and the results were negative.

## 2020-07-20 NOTE — Discharge Instructions (Addendum)
Take medication as prescribed. Rest. Drink plenty of fluids. Tylenol as needed. Plain robitussin.  Follow up with your primary care physician this week as needed. Return to Urgent care for new or worsening concerns.

## 2020-08-12 DIAGNOSIS — O24419 Gestational diabetes mellitus in pregnancy, unspecified control: Secondary | ICD-10-CM | POA: Insufficient documentation

## 2020-10-13 DIAGNOSIS — O1413 Severe pre-eclampsia, third trimester: Secondary | ICD-10-CM | POA: Insufficient documentation

## 2021-03-28 DIAGNOSIS — N912 Amenorrhea, unspecified: Secondary | ICD-10-CM

## 2021-03-28 HISTORY — DX: Amenorrhea, unspecified: N91.2

## 2021-04-23 ENCOUNTER — Ambulatory Visit
Admission: EM | Admit: 2021-04-23 | Discharge: 2021-04-23 | Disposition: A | Discharge status: Home / Self Care | Payer: Medicaid Other | Attending: Emergency Medicine | Admitting: Emergency Medicine

## 2021-04-23 ENCOUNTER — Other Ambulatory Visit: Payer: Self-pay

## 2021-04-23 DIAGNOSIS — J069 Acute upper respiratory infection, unspecified: Secondary | ICD-10-CM | POA: Diagnosis present

## 2021-04-23 DIAGNOSIS — H6502 Acute serous otitis media, left ear: Secondary | ICD-10-CM | POA: Insufficient documentation

## 2021-04-23 LAB — GROUP A STREP BY PCR: Group A Strep by PCR: NOT DETECTED

## 2021-04-23 MED ORDER — AMOXICILLIN 500 MG PO CAPS: 500.0000 mg | ORAL_CAPSULE | Freq: Two times a day (BID) | ORAL | 0 refills | Status: AC

## 2021-04-23 NOTE — ED Provider Notes (Signed)
[facility]    CSN: [id number] Arrival date & time: 04/23/21  1143      History   Chief Complaint Chief Complaint  Patient presents with   Cough   Otalgia    HPI Christine Oconnor is a 26 y.o. female.   Patient presents with non productive cough, nasal congestion and bilateral ear pain for 2 days. Known sick contacts.  Has attempted use of over-the-counter NyQuil which was helpful for sleeping.  Tolerating food and liquids.  History of anemia and PCOS.    Past Medical History:  Diagnosis Date   Anemia    PCOS (polycystic ovarian syndrome)     There are no problems to display for this patient.   Past Surgical History:  Procedure Laterality Date   ANKLE SURGERY     BACK SURGERY      OB History     Gravida  2   Para  0   Term  0   Preterm  0   AB  1   Living  0      SAB  1   IAB  0   Ectopic  0   Multiple  0   Live Births  0            Home Medications    Prior to Admission medications   Medication Sig Start Date End Date Taking? Authorizing Provider  levonorgestrel-ethinyl estradiol (ALESSE) 0.1-20 MG-MCG tablet Take 1 tablet by mouth daily. 12/29/20 12/29/21 Yes [provider]  metFORMIN (GLUCOPHAGE) 500 MG tablet Take 1 tablet by mouth daily with breakfast. 03/30/21  Yes [provider]  NIFEdipine (PROCARDIA-XL/NIFEDICAL-XL) 30 MG 24 hr tablet Take by mouth. 10/17/20 10/17/21 Yes [provider]  phentermine 15 MG capsule Take by mouth. 03/30/21 04/29/21 Yes [provider]  amoxicillin (AMOXIL) 875 MG tablet Take 1 tablet (875 mg total) by mouth 2 (two) times daily. 07/20/20   Renford DillsMiller, Lindsey, NP  gabapentin (NEURONTIN) 100 MG capsule Take 1 capsule (100 mg total) by mouth at bedtime. 04/03/17 05/03/17  Menshew, Charlesetta IvoryJenise V Bacon, PA-C  levonorgestrel-ethinyl estradiol (AVIANE,ALESSE,LESSINA) 0.1-20 MG-MCG tablet Take by mouth. 10/25/17 10/25/18  [provider]  metFORMIN (GLUCOPHAGE) 500 MG  tablet Take by mouth. 10/25/17 10/25/18  [provider]  oseltamivir (TAMIFLU) 75 MG capsule Take 1 capsule (75 mg total) by mouth every 12 (twelve) hours. 04/09/18   Renford DillsMiller, Lindsey, NP  topiramate (TOPAMAX) 100 MG tablet Take by mouth. 10/25/17 10/25/18  [provider]    Family History History reviewed. No pertinent family history.  Social History Social History   Tobacco Use   Smoking status: Never   Smokeless tobacco: Never  Substance Use Topics   Alcohol use: Not Currently    Alcohol/week: 1.0 standard drink    Types: 1 Standard drinks or equivalent per week    Comment: last use2 weeks ago- vodka   Drug use: Yes    Frequency: 7.0 times per week    Types: Marijuana    Comment: smokes marijuana daily- last use 02/22/20     Allergies   Nifedipine   Review of Systems Review of Systems  Constitutional: Negative.   HENT:  Positive for congestion, ear pain, rhinorrhea, sinus pressure and sore throat. Negative for dental problem, drooling, ear discharge, facial swelling, hearing loss, mouth sores, nosebleeds, postnasal drip, sinus pain, sneezing, tinnitus, trouble swallowing and voice change.   Respiratory:  Positive for cough. Negative for apnea, choking, chest tightness, shortness of  breath, wheezing and stridor.   Cardiovascular: Negative.   Gastrointestinal: Negative.   Neurological: Negative.     Physical Exam Triage Vital Signs ED Triage Vitals  Enc Vitals Group     BP 04/23/21 1224 (!) 118/94     Pulse Rate 04/23/21 1224 82     Resp 04/23/21 1224 18     Temp 04/23/21 1224 98.9 F (37.2 C)     Temp Source 04/23/21 1224 Oral     SpO2 04/23/21 1224 96 %     Weight --      Height --      Head Circumference --      Peak Flow --      Pain Score 04/23/21 1225 5     Pain Loc --      Pain Edu? --      Excl. in GC? --    No data found.  Updated Vital Signs BP (!) 118/94 (BP Location: Right Arm)    Pulse 82    Temp 98.9 F (37.2 C) (Oral)     Resp 18    SpO2 96%    Breastfeeding No   Visual Acuity Right Eye Distance:   Left Eye Distance:   Bilateral Distance:    Right Eye Near:   Left Eye Near:    Bilateral Near:     Physical Exam Constitutional:      Appearance: Normal appearance.  HENT:     Head: Macrocephalic.     Right Ear: Hearing, tympanic membrane, ear canal and external ear normal.     Left Ear: Tympanic membrane is erythematous.     Nose: Nose normal.     Mouth/Throat:     Mouth: Mucous membranes are moist.     Pharynx: Posterior oropharyngeal erythema present.  Eyes:     Extraocular Movements: Extraocular movements intact.  Cardiovascular:     Rate and Rhythm: Normal rate and regular rhythm.     Pulses: Normal pulses.     Heart sounds: Normal heart sounds.  Pulmonary:     Effort: Pulmonary effort is normal.     Breath sounds: Normal breath sounds.  Musculoskeletal:     Cervical back: Normal range of motion and neck supple.  Skin:    General: Skin is warm and dry.  Neurological:     Mental Status: She is alert and oriented to person, place, and time. Mental status is at baseline.  Psychiatric:        Mood and Affect: Mood normal.        Behavior: Behavior normal.     UC Treatments / Results  Labs (all labs ordered are listed, but only abnormal results are displayed) Labs Reviewed  GROUP A STREP BY PCR    EKG   Radiology No results found.  Procedures Procedures (including critical care time)  Medications Ordered in UC Medications - No data to display  Initial Impression / Assessment and Plan / UC Course  I have reviewed the triage vital signs and the nursing notes.  Pertinent labs & imaging results that were available during my care of the patient were reviewed by me and considered in my medical decision making (see chart for details).  Viral URI with cough Nonrecurrent acute serous otitis media of left ear  Vital signs are stable, patient is in no signs of distress, O2  saturation 96% on room air, etiology of symptoms most likely viral as other members of the household has similar symptoms, erythema in the  tympanic membrane on the left side noted on exam, amoxicillin 10-day course prescribed for treatment, over-the-counter medications for remaining symptom management, urgent care follow-up as needed Final Clinical Impressions(s) / UC Diagnoses   Final diagnoses:  None   Discharge Instructions   None    ED Prescriptions   None    PDMP not reviewed this encounter.   Valinda Hoar, [place] 04/23/21 1516

## 2021-04-23 NOTE — ED Triage Notes (Signed)
Pt reports sore throat, cough, congestion and bilateral era pain x 2 days.

## 2021-04-23 NOTE — Discharge Instructions (Signed)
Your symptoms today are most likely being caused by a virus and should steadily improve in time it can take up to 7 to 10 days before you truly start to see a turnaround however things will get better  On exam your left ear appeared to be infected, take amoxicillin twice a day for the next 10 days  May attempt any of the following below in addition    You can take Tylenol and/or Ibuprofen as needed for fever reduction and pain relief.   For cough: honey 1/2 to 1 teaspoon (you can dilute the honey in water or another fluid).  You can also use guaifenesin and dextromethorphan for cough. You can use a humidifier for chest congestion and cough.  If you don't have a humidifier, you can sit in the bathroom with the hot shower running.      For sore throat: try warm salt water gargles, cepacol lozenges, throat spray, warm tea or water with lemon/honey, popsicles or ice, or OTC cold relief medicine for throat discomfort.   For congestion: take a daily anti-histamine like Zyrtec, Claritin, and a oral decongestant, such as pseudoephedrine.  You can also use Flonase 1-2 sprays in each nostril daily.   It is important to stay hydrated: drink plenty of fluids (water, gatorade/powerade/pedialyte, juices, or teas) to keep your throat moisturized and help further relieve irritation/discomfort.

## 2021-05-31 ENCOUNTER — Other Ambulatory Visit: Payer: Self-pay

## 2021-05-31 ENCOUNTER — Ambulatory Visit
Admission: EM | Admit: 2021-05-31 | Discharge: 2021-05-31 | Disposition: A | Discharge status: Home / Self Care | Payer: Medicaid Other | Attending: Student | Admitting: Student

## 2021-05-31 DIAGNOSIS — S91111A Laceration without foreign body of right great toe without damage to nail, initial encounter: Secondary | ICD-10-CM | POA: Diagnosis not present

## 2021-05-31 MED ORDER — CEPHALEXIN 500 MG PO CAPS: 500.0000 mg | ORAL_CAPSULE | Freq: Four times a day (QID) | ORAL | 0 refills | Status: DC

## 2021-05-31 NOTE — Discharge Instructions (Addendum)
-  Start the antibiotic: Keflex, 4x daily x5 days. You can take this with food if you have a sensitive stomach. ?-Wash your wound with gentle soap and water 1-2 times daily.  Let air dry or gently pat. You can follow with over-the-counter neosporin ointment (or similar). Keep wrapped during the day or when you're doing something that could get it dirty (working, yardwork, cooking, etc). Avoid cleansing with hydrogen peroxide or alcohol!! ?-Seek additional medical attention if the wound is getting worse instead of better- redness increasing in size, pain getting worse, new/worsening discharge, new fevers/chills, etc. ? ?

## 2021-05-31 NOTE — ED Provider Notes (Addendum)
?[place] ? ? ? ?CSN: [id number] ?Arrival date & time: 05/31/21  1748 ? ? ?  ? ?History   ?Chief Complaint ?Chief Complaint  ?Patient presents with  ? Toe Pain  ? ? ?HPI ?Christine Oconnor is a 26 y.o. female presenting with right great toe pain following large splinter.  History noncontributory.  Describes a piece of a table leg stabbing between her right great toe and sandal, causing a small cut.  States she is concerned there is still a splinter in there, though she removed all of the wood.  Initially with bleeding but no longer.  She is not immunocompromised, denies diabetes.  Last Tdap about 1 year ago per patient. ? ?HPI ? ?Past Medical History:  ?Diagnosis Date  ? Anemia   ? PCOS (polycystic ovarian syndrome)   ? ? ?There are no problems to display for this patient. ? ? ?Past Surgical History:  ?Procedure Laterality Date  ? ANKLE SURGERY    ? BACK SURGERY    ? ? ?OB History   ? ? Gravida  ?2  ? Para  ?0  ? Term  ?0  ? Preterm  ?0  ? AB  ?1  ? Living  ?0  ?  ? ? SAB  ?1  ? IAB  ?0  ? Ectopic  ?0  ? Multiple  ?0  ? Live Births  ?0  ?   ?  ?  ? ? ? ?Home Medications   ? ?Prior to Admission medications   ?Medication Sig Start Date End Date Taking? Authorizing Provider  ?cephALEXin (KEFLEX) 500 MG capsule Take 1 capsule (500 mg total) by mouth 4 (four) times daily. 05/31/21  Yes Hazel Sams, PA-C  ?levonorgestrel-ethinyl estradiol (AVIANE,ALESSE,LESSINA) 0.1-20 MG-MCG tablet Take by mouth. 10/25/17 05/31/21 Yes [provider]  ?metFORMIN (GLUCOPHAGE) 500 MG tablet Take by mouth. 10/25/17 05/31/21 Yes [provider]  ?phentermine 15 MG capsule Take by mouth. 03/30/21 05/31/21 Yes [provider]  ?topiramate (TOPAMAX) 100 MG tablet Take by mouth. 10/25/17 05/31/21 Yes [provider]  ?gabapentin (NEURONTIN) 100 MG capsule Take 1 capsule (100 mg total) by mouth at bedtime. 04/03/17 05/03/17  Menshew, Dannielle Karvonen, PA-C  ?levonorgestrel-ethinyl estradiol (ALESSE) 0.1-20 MG-MCG  tablet Take 1 tablet by mouth daily. 12/29/20 12/29/21  [provider]  ?metFORMIN (GLUCOPHAGE) 500 MG tablet Take 1 tablet by mouth daily with breakfast. 03/30/21   [provider]  ?NIFEdipine (PROCARDIA-XL/NIFEDICAL-XL) 30 MG 24 hr tablet Take by mouth. 10/17/20 10/17/21  [provider]  ?oseltamivir (TAMIFLU) 75 MG capsule Take 1 capsule (75 mg total) by mouth every 12 (twelve) hours. 04/09/18   Marylene Land, NP  ? ? ?Family History ?No family history on file. ? ?Social History ?Social History  ? ?Tobacco Use  ? Smoking status: Never  ? Smokeless tobacco: Never  ?Vaping Use  ? Vaping Use: Every day  ?Substance Use Topics  ? Alcohol use: Yes  ?  Alcohol/week: 1.0 standard drink  ?  Types: 1 Standard drinks or equivalent per week  ?  Comment: Occ.  ? Drug use: Not Currently  ?  Frequency: 7.0 times per week  ?  Types: Marijuana  ?  Comment: smokes marijuana daily- last use 02/22/20  ? ? ? ?Allergies   ?Nifedipine ? ? ?Review of Systems ?Review of Systems  ?Skin:  Positive for wound.  ?All other systems reviewed and are negative. ? ? ?Physical Exam ?Triage Vital Signs ?ED Triage Vitals  ?  Enc Vitals Group  ?   BP 05/31/21 1759 (!) 130/101  ?   Pulse Rate 05/31/21 1759 80  ?   Resp 05/31/21 1759 18  ?   Temp 05/31/21 1759 98.2 ?F (36.8 ?C)  ?   Temp Source 05/31/21 1759 Oral  ?   SpO2 05/31/21 1759 100 %  ?   Weight 05/31/21 1756 270 lb (122.5 kg)  ?   Height 05/31/21 1756 5\' 6"  (1.676 m)  ?   Head Circumference --   ?   Peak Flow --   ?   Pain Score 05/31/21 1755 8  ?   Pain Loc --   ?   Pain Edu? --   ?   Excl. in [place]? --   ? ?No data found. ? ?Updated Vital Signs ?BP (!) 130/101 (BP Location: Left Arm)   Pulse 80   Temp 98.2 ?F (36.8 ?C) (Oral)   Resp 18   Ht 5\' 6"  (1.676 m)   Wt 270 lb (122.5 kg)   LMP  (LMP Unknown)   SpO2 100%   BMI 43.58 kg/m?  ? ?Visual Acuity ?Right Eye Distance:   ?Left Eye Distance:   ?Bilateral Distance:   ? ?Right Eye Near:   ?Left Eye Near:    ?Bilateral  Near:    ? ?Physical Exam ?Vitals reviewed.  ?Constitutional:   ?   General: She is not in acute distress. ?   Appearance: Normal appearance. She is not ill-appearing.  ?HENT:  ?   Head: Normocephalic and atraumatic.  ?Pulmonary:  ?   Effort: Pulmonary effort is normal.  ?Skin: ?   Comments: See image below ?R great toe with small 1cm shallow laceration, not actively bleeding. No foreign bodies present. TTP. Cap refill <2 seconds. No IP joint or MTP joint tenderness.   ?Neurological:  ?   General: No focal deficit present.  ?   Mental Status: She is alert and oriented to person, place, and time.  ?Psychiatric:     ?   Mood and Affect: Mood normal.     ?   Behavior: Behavior normal.     ?   Thought Content: Thought content normal.     ?   Judgment: Judgment normal.  ? ? ? ? ? ?UC Treatments / Results  ?Labs ?(all labs ordered are listed, but only abnormal results are displayed) ?Labs Reviewed - No data to display ? ?EKG ? ? ?Radiology ?No results found. ? ?Procedures ?Procedures (including critical care time) ? ?Medications Ordered in UC ?Medications - No data to display ? ?Initial Impression / Assessment and Plan / UC Course  ?I have reviewed the triage vital signs and the nursing notes. ? ?Pertinent labs & imaging results that were available during my care of the patient were reviewed by me and considered in my medical decision making (see chart for details). ? ?  ? ?This patient is a very pleasant 26 y.o. year old female presenting with R great toe - shallow 1cm laceration from a splinter. Neurovascularly intact. Splinter has already been removed. Did not check an xray as there is no bony pain and trace wood would not show on xray; patient is in agreement. Wound care provided and discussed. Tdap UTD 1 year ago per pt. ED return precautions discussed. Patient verbalizes understanding and agreement.  ? ?Keflex sent given patient concern for infection. She is not immunocompromised; the wound is clean and not  contaminated.  ? ? ?Final Clinical Impressions(s) /  UC Diagnoses  ? ?Final diagnoses:  ?Laceration of right great toe without foreign body present or damage to nail, initial encounter  ? ? ? ?Discharge Instructions   ? ?  ?-Start the antibiotic: Keflex, 4x daily x5 days. You can take this with food if you have a sensitive stomach. ?-Wash your wound with gentle soap and water 1-2 times daily.  Let air dry or gently pat. You can follow with over-the-counter neosporin ointment (or similar). Keep wrapped during the day or when you're doing something that could get it dirty (working, [employer], cooking, [employer] research officer, government). Avoid cleansing with hydrogen peroxide or alcohol!! ?-Seek additional medical attention if the wound is getting worse instead of better- redness increasing in size, pain getting worse, new/worsening discharge, new fevers/chills, etc. ? ? ? ?ED Prescriptions   ? ? Medication Sig Dispense Auth. Provider  ? cephALEXin (KEFLEX) 500 MG capsule Take 1 capsule (500 mg total) by mouth 4 (four) times daily. 20 capsule Hazel Sams, PA-C  ? ?  ? ?PDMP not reviewed this encounter. ?  ?Hazel Sams, PA-C ?05/31/21 1850 ? ?  ?Hazel Sams, PA-C ?05/31/21 1850 ? ?

## 2021-05-31 NOTE — ED Triage Notes (Signed)
Patient is here for "Toe Pain" due to injury. Piece of wood came off of table and stabbed "right great toe". DOI: [id number]. Time: 530 pm "ish".  ?

## 2021-06-11 ENCOUNTER — Ambulatory Visit
Admission: EM | Admit: 2021-06-11 | Discharge: 2021-06-11 | Disposition: A | Discharge status: Home / Self Care | Payer: Medicaid Other | Attending: Physician Assistant | Admitting: Physician Assistant

## 2021-06-11 ENCOUNTER — Other Ambulatory Visit: Payer: Self-pay

## 2021-06-11 DIAGNOSIS — M542 Cervicalgia: Secondary | ICD-10-CM | POA: Diagnosis present

## 2021-06-11 DIAGNOSIS — R591 Generalized enlarged lymph nodes: Secondary | ICD-10-CM | POA: Diagnosis present

## 2021-06-11 DIAGNOSIS — J029 Acute pharyngitis, unspecified: Secondary | ICD-10-CM | POA: Insufficient documentation

## 2021-06-11 LAB — MONONUCLEOSIS SCREEN: Mono Screen: NEGATIVE

## 2021-06-11 LAB — GROUP A STREP BY PCR: Group A Strep by PCR: NOT DETECTED

## 2021-06-11 MED ORDER — PREDNISONE 20 MG PO TABS: 40.0000 mg | ORAL_TABLET | Freq: Every day | ORAL | 0 refills | Status: AC

## 2021-06-11 NOTE — Discharge Instructions (Signed)
-  Strep test is negative.  We are testing for mono we will call you with the results. ?- I sent prednisone to help with your throat swelling and also with lymph node swelling. ?- Advises no mouthwash to help with pain and you can take ibuprofen and Tylenol 2.  Increase rest and fluids.  If mono is negative, likely another virus which is contagious to others and will need to run its course over about a week to week and a half. ?

## 2021-06-11 NOTE — ED Provider Notes (Signed)
?[facility] ? ? ? ?CSN: [id number] ?Arrival date & time: 06/11/21  1342 ? ? ?  ? ?History   ?Chief Complaint ?Chief Complaint  ?Patient presents with  ? Sore Throat  ? Neck Pain  ? ? ?HPI ?Christine Oconnor is a 26 y.o. female presenting for approximately 2-day history of sore throat, swollen lymph nodes of neck, neck pain, nasal congestion and sinus pressure.  Patient denies fever.  Temp in clinic is 99.4 degrees.  She is somewhat fatigued.  Denies cough, breathing trouble, nausea/vomiting and diarrhea.  No contacts with any similar symptoms.  She does report taking a at home COVID test and says it was negative.  Has not taken any medicine over-the-counter for her symptoms.  Past medical history significant for PCOS. ? ?HPI ? ?Past Medical History:  ?Diagnosis Date  ? Anemia   ? PCOS (polycystic ovarian syndrome)   ? ? ?There are no problems to display for this patient. ? ? ?Past Surgical History:  ?Procedure Laterality Date  ? ANKLE SURGERY    ? BACK SURGERY    ? ? ?OB History   ? ? Gravida  ?2  ? Para  ?0  ? Term  ?0  ? Preterm  ?0  ? AB  ?1  ? Living  ?0  ?  ? ? SAB  ?1  ? IAB  ?0  ? Ectopic  ?0  ? Multiple  ?0  ? Live Births  ?0  ?   ?  ?  ? ? ? ?Home Medications   ? ?Prior to Admission medications   ?Medication Sig Start Date End Date Taking? Authorizing Provider  ?levonorgestrel-ethinyl estradiol (ALESSE) 0.1-20 MG-MCG tablet Take 1 tablet by mouth daily. 12/29/20 12/29/21 Yes [provider]  ?metFORMIN (GLUCOPHAGE) 500 MG tablet Take 1 tablet by mouth daily with breakfast. 03/30/21  Yes [provider]  ?NIFEdipine (PROCARDIA-XL/NIFEDICAL-XL) 30 MG 24 hr tablet Take by mouth. 10/17/20 10/17/21 Yes [provider]  ?predniSONE (DELTASONE) 20 MG tablet Take 2 tablets (40 mg total) by mouth daily for 5 days. 06/11/21 06/16/21 Yes Eusebio FriendlyEaves, Kellie Murrill B, PA-C  ?gabapentin (NEURONTIN) 100 MG capsule Take 1 capsule (100 mg total) by mouth at bedtime. 04/03/17 05/03/17  Menshew, Charlesetta IvoryJenise V Bacon,  PA-C  ?levonorgestrel-ethinyl estradiol (AVIANE,ALESSE,LESSINA) 0.1-20 MG-MCG tablet Take by mouth. 10/25/17 05/31/21  [provider]  ?metFORMIN (GLUCOPHAGE) 500 MG tablet Take by mouth. 10/25/17 05/31/21  [provider]  ?phentermine 15 MG capsule Take by mouth. 03/30/21 05/31/21  [provider]  ?topiramate (TOPAMAX) 100 MG tablet Take by mouth. 10/25/17 05/31/21  [provider]  ? ? ?Family History ?History reviewed. No pertinent family history. ? ?Social History ?Social History  ? ?Tobacco Use  ? Smoking status: Never  ? Smokeless tobacco: Never  ?Vaping Use  ? Vaping Use: Every day  ?Substance Use Topics  ? Alcohol use: Yes  ?  Alcohol/week: 1.0 standard drink  ?  Types: 1 Standard drinks or equivalent per week  ?  Comment: Occ.  ? Drug use: Not Currently  ?  Frequency: 7.0 times per week  ?  Types: Marijuana  ?  Comment: smokes marijuana daily- last use 02/22/20  ? ? ? ?Allergies   ?Nifedipine ? ? ?Review of Systems ?Review of Systems  ?Constitutional:  Positive for fatigue. Negative for chills, diaphoresis and fever.  ?HENT:  Positive for congestion, rhinorrhea, sinus pressure and sore throat. Negative for ear pain.   ?Respiratory:  Negative for  cough and shortness of breath.   ?Cardiovascular:  Negative for chest pain.  ?Gastrointestinal:  Negative for abdominal pain, nausea and vomiting.  ?Musculoskeletal:  Positive for neck pain. Negative for arthralgias and myalgias.  ?Skin:  Negative for rash.  ?Neurological:  Negative for weakness and headaches.  ?Hematological:  Positive for adenopathy.  ? ? ?Physical Exam ?Triage Vital Signs ?ED Triage Vitals  ?Enc Vitals Group  ?   BP 06/11/21 1355 114/83  ?   Pulse Rate 06/11/21 1355 (!) 102  ?   Resp 06/11/21 1355 18  ?   Temp 06/11/21 1355 99.4 ?F (37.4 ?C)  ?   Temp Source 06/11/21 1355 Oral  ?   SpO2 06/11/21 1355 97 %  ?   Weight 06/11/21 1353 270 lb (122.5 kg)  ?   Height 06/11/21 1353 5\' 6"  (1.676 m)  ?   Head Circumference --   ?    Peak Flow --   ?   Pain Score 06/11/21 1352 7  ?   Pain Loc --   ?   Pain Edu? --   ?   Excl. in [place]? --   ? ?No data found. ? ?Updated Vital Signs ?BP 114/83 (BP Location: Left Arm)   Pulse (!) 102   Temp 99.4 ?F (37.4 ?C) (Oral)   Resp 18   Ht 5\' 6"  (1.676 m)   Wt 270 lb (122.5 kg)   LMP 06/04/2021   SpO2 97%   BMI 43.58 kg/m?  ? ?   ? ?Physical Exam ?Vitals and nursing note reviewed.  ?Constitutional:   ?   General: She is not in acute distress. ?   Appearance: Normal appearance. She is obese. She is ill-appearing. She is not toxic-appearing.  ?HENT:  ?   Head: Normocephalic and atraumatic.  ?   Nose: Congestion present.  ?   Mouth/Throat:  ?   Mouth: Mucous membranes are moist.  ?   Pharynx: Oropharynx is clear. Posterior oropharyngeal erythema (moderate swelling posterior pharynx) present.  ?   Tonsils: Tonsillar exudate (left) present.  ?Eyes:  ?   General: No scleral icterus.    ?   Right eye: No discharge.     ?   Left eye: No discharge.  ?   Conjunctiva/sclera: Conjunctivae normal.  ?Cardiovascular:  ?   Rate and Rhythm: Regular rhythm. Tachycardia present.  ?   Heart sounds: Normal heart sounds.  ?Pulmonary:  ?   Effort: Pulmonary effort is normal. No respiratory distress.  ?   Breath sounds: Normal breath sounds.  ?Musculoskeletal:  ?   Cervical back: Neck supple.  ?Lymphadenopathy:  ?   Cervical: Cervical adenopathy present.  ?Skin: ?   General: Skin is dry.  ?Neurological:  ?   General: No focal deficit present.  ?   Mental Status: She is alert. Mental status is at baseline.  ?   Motor: No weakness.  ?   Gait: Gait normal.  ?Psychiatric:     ?   Mood and Affect: Mood normal.     ?   Behavior: Behavior normal.     ?   Thought Content: Thought content normal.  ? ? ? ?UC Treatments / Results  ?Labs ?(all labs ordered are listed, but only abnormal results are displayed) ?Labs Reviewed  ?GROUP A STREP BY PCR  ?MONONUCLEOSIS SCREEN  ? ? ?EKG ? ? ?Radiology ?No results found. ? ?Procedures ?Procedures  (including critical care time) ? ?Medications Ordered in UC ?Medications -  No data to display ? ?Initial Impression / Assessment and Plan / UC Course  ?I have reviewed the triage vital signs and the nursing notes. ? ?Pertinent labs & imaging results that were available during my care of the patient were reviewed by me and considered in my medical decision making (see chart for details). ? ?26 year old female presenting for sore throat, nasal congestion, sinus pressure and neck pain x2 days.  No fever. ? ?Vitals stable.  She is mildly ill-appearing but nontoxic.  Nasal congestion on exam, mild posterior pharyngeal erythema with whitish exudates on the left side and tender and enlarged anterior cervical lymph node chains bilaterally. ? ?PCR strep obtained.  Negative.  Mono test obtained.  Advised patient we will contact her with results. Negative mono.  ? ?Pharyngitis due to viral cause.  Advised increasing rest and fluids.  Sent prednisone to help with swelling.  Tylenol for peak COVID Chloraseptic.  COVID culture looks.  Reviewed return and ER precautions. ? ? ?Final Clinical Impressions(s) / UC Diagnoses  ? ?Final diagnoses:  ?Exudative pharyngitis  ?Neck pain  ?Lymphadenopathy of head and neck  ? ? ? ?Discharge Instructions   ? ?  ?-Strep test is negative.  We are testing for mono we will call you with the results. ?- I sent prednisone to help with your throat swelling and also with lymph node swelling. ?- Advises no mouthwash to help with pain and you can take ibuprofen and Tylenol 2.  Increase rest and fluids.  If mono is negative, likely another virus which is contagious to others and will need to run its course over about a week to week and a half. ? ? ? ? ?ED Prescriptions   ? ? Medication Sig Dispense Auth. Provider  ? predniSONE (DELTASONE) 20 MG tablet Take 2 tablets (40 mg total) by mouth daily for 5 days. 10 tablet Shirlee Latch, PA-C  ? ?  ? ?PDMP not reviewed this encounter. ?  ?Shirlee Latch,  PA-C ?06/11/21 1636 ? ?

## 2021-06-11 NOTE — ED Triage Notes (Signed)
Pt c/o sore throat, neck pain, nasal congestion, sinus pressure x2days ? ?Pt took a covid test at home and it was negative. Pt is worried about meningitis  ?

## 2021-06-23 ENCOUNTER — Ambulatory Visit
Admission: EM | Admit: 2021-06-23 | Discharge: 2021-06-23 | Disposition: A | Discharge status: Home / Self Care | Payer: Medicaid Other | Attending: Student | Admitting: Student

## 2021-06-23 DIAGNOSIS — O200 Threatened abortion: Secondary | ICD-10-CM | POA: Insufficient documentation

## 2021-06-23 DIAGNOSIS — J029 Acute pharyngitis, unspecified: Secondary | ICD-10-CM | POA: Diagnosis not present

## 2021-06-23 DIAGNOSIS — B9789 Other viral agents as the cause of diseases classified elsewhere: Secondary | ICD-10-CM | POA: Insufficient documentation

## 2021-06-23 DIAGNOSIS — T84019A Broken internal joint prosthesis, unspecified site, initial encounter: Secondary | ICD-10-CM | POA: Insufficient documentation

## 2021-06-23 DIAGNOSIS — T85698A Other mechanical complication of other specified internal prosthetic devices, implants and grafts, initial encounter: Secondary | ICD-10-CM | POA: Insufficient documentation

## 2021-06-23 DIAGNOSIS — J028 Acute pharyngitis due to other specified organisms: Secondary | ICD-10-CM | POA: Insufficient documentation

## 2021-06-23 DIAGNOSIS — O3680X0 Pregnancy with inconclusive fetal viability, not applicable or unspecified: Secondary | ICD-10-CM | POA: Insufficient documentation

## 2021-06-23 LAB — GROUP A STREP BY PCR: Group A Strep by PCR: NOT DETECTED

## 2021-06-23 NOTE — ED Triage Notes (Signed)
Patient is here for "st". Started "x 2 wks ago". Came here recently "viral infection" (strep test then was Negative). No fever. No rash (new/unexplained).  ?

## 2021-06-23 NOTE — Discharge Instructions (Signed)
Try over the counter allergy medicines  ?

## 2021-06-25 NOTE — ED Provider Notes (Signed)
?[place] ? ? ? ?CSN: [id number] ?Arrival date & time: 06/23/21  1030 ? ? ?  ? ?History   ?Chief Complaint ?Chief Complaint  ?Patient presents with  ? Sore Throat  ? ? ?HPI ?OCIA Christine Oconnor is a 26 y.o. female.  ? ?The history is provided by the patient. No language interpreter was used.  ?Sore Throat ?This is a new problem. Episode onset: 2-week. The problem occurs constantly. Nothing aggravates the symptoms. She has tried nothing for the symptoms. The treatment provided no relief.  ? ?Past Medical History:  ?Diagnosis Date  ? Anemia   ? PCOS (polycystic ovarian syndrome)   ? ? ?Patient Active Problem List  ? Diagnosis Date Noted  ? 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 Pregnancy with uncertain fetal viability 06/23/2021  ? Threatened abortion in first trimester 06/23/2021  ? Preeclampsia, severe, third trimester 10/13/2020  ? Gestational diabetes mellitus (GDM) in third trimester 08/12/2020  ? Excessive weight gain during pregnancy in second trimester 07/14/2020  ? History of back surgery 04/14/2020  ? Obesity 03/31/2020  ? Depression during pregnancy in first trimester 03/31/2020  ? Immunization counseling 03/31/2020  ? High-risk pregnancy 03/06/2020  ? PCOS (polycystic ovarian syndrome) 12/15/2016  ? ? ?Past Surgical History:  ?Procedure Laterality Date  ? ANKLE SURGERY    ? BACK SURGERY    ? ? ?OB History   ? ? Gravida  ?2  ? Para  ?0  ? Term  ?0  ? Preterm  ?0  ? AB  ?1  ? Living  ?0  ?  ? ? SAB  ?1  ? IAB  ?0  ? Ectopic  ?0  ? Multiple  ?0  ? Live Births  ?0  ?   ?  ?  ? ? ? ?Home Medications   ? ?Prior to Admission medications   ?Medication Sig Start Date End Date Taking? Authorizing Provider  ?acetaminophen (TYLENOL) 325 MG tablet Take by mouth. 10/16/20  Yes [provider]  ?Blood Pressure Monitoring (ADULT BLOOD PRESSURE CUFF LG) KIT Check blood pressure as indicated by your healthcare provider 10/16/20  Yes [provider]  ?ibuprofen (ADVIL) 600 MG tablet Take by mouth. 10/16/20  Yes [provider]  ?metFORMIN (GLUCOPHAGE) 500 MG tablet Take 1 tablet by mouth daily with breakfast. 03/30/21  Yes [provider]  ?NIFEdipine (PROCARDIA-XL/NIFEDICAL-XL) 30 MG 24 hr tablet Take by mouth. 10/17/20 10/17/21 Yes [provider]  ?nystatin cream (MYCOSTATIN) Apply topically 2 (two) times daily. 12/29/20 12/29/21 Yes [provider]  ?topiramate (TOPAMAX) 25 MG tablet Take 25 mg by mouth 2 (two) times daily. 05/04/21  Yes [provider]  ?cephALEXin (KEFLEX) 500 MG capsule cephalexin 500 mg capsule ? Take 1 capsule every 8 hours by oral route for 10 days.    [provider]  ?fluconazole (DIFLUCAN) 100 MG tablet Take 100 mg by mouth daily. 12/29/20   [provider]  ?levonorgestrel-ethinyl estradiol (ALESSE) 0.1-20 MG-MCG tablet Take 1 tablet by mouth daily. 12/29/20 12/29/21  [provider]  ?Multiple Vitamin (MULTIVITAMIN PO) Take 1 tablet by mouth daily.    [provider]  ?phentermine 15 MG capsule Take by mouth. 03/30/21 06/23/21  [provider]  ? ? ?Family History ?No family history on file. ? ?Social History ?Social History  ? ?Tobacco Use  ? Smoking status: Never  ? Smokeless tobacco: Never  ?Vaping Use  ? Vaping  Use: Never used  ?Substance Use Topics  ? Alcohol use: Yes  ?  Alcohol/week: 1.0 standard drink  ?  Types: 1 Standard drinks or equivalent per week  ?  Comment: Occ.  ? Drug use: Not Currently  ?  Frequency: 7.0 times per week  ?  Types: Marijuana  ?  Comment: smokes marijuana daily- last use 02/22/20  ? ? ? ?Allergies   ?Nifedipine ? ? ?Review of Systems ?Review of Systems  ?All other systems reviewed and are negative. ? ? ?Physical Exam ?Triage Vital Signs ?ED Triage Vitals  ?Enc Vitals Group  ?   BP 06/23/21 1105 128/82  ?   Pulse Rate 06/23/21 1105 92  ?   Resp 06/23/21 1105 18  ?   Temp 06/23/21 1105 98 ?F (36.7 ?C)  ?   Temp  Source 06/23/21 1105 Oral  ?   SpO2 06/23/21 1105 97 %  ?   Weight 06/23/21 1102 270 lb (122.5 kg)  ?   Height 06/23/21 1102 5' 6"  (1.676 m)  ?   Head Circumference --   ?   Peak Flow --   ?   Pain Score 06/23/21 1101 9  ?   Pain Loc --   ?   Pain Edu? --   ?   Excl. in [place]? --   ? ?No data found. ? ?Updated Vital Signs ?BP 128/82 (BP Location: Left Arm)   Pulse 92   Temp 98 ?F (36.7 ?C) (Oral)   Resp 18   Ht 5' 6"  (1.676 m)   Wt 122.5 kg   LMP 06/04/2021 (Exact Date)   SpO2 97%   BMI 43.58 kg/m?  ? ?Visual Acuity ?Right Eye Distance:   ?Left Eye Distance:   ?Bilateral Distance:   ? ?Right Eye Near:   ?Left Eye Near:    ?Bilateral Near:    ? ?Physical Exam ?Vitals and nursing note reviewed.  ?Constitutional:   ?   Appearance: She is well-developed.  ?HENT:  ?   Head: Normocephalic.  ?   Nose: No congestion or rhinorrhea.  ?   Mouth/Throat:  ?   Mouth: Mucous membranes are moist.  ?   Pharynx: Pharyngeal swelling and posterior oropharyngeal erythema present.  ?Pulmonary:  ?   Effort: Pulmonary effort is normal.  ?Abdominal:  ?   General: There is no distension.  ?Musculoskeletal:     ?   General: Normal range of motion.  ?   Cervical back: Normal range of motion.  ?Neurological:  ?   Mental Status: She is alert and oriented to person, place, and time.  ? ?MDM strep screen is negative patient has had some sore throat for the past 2 weeks patient is counseled on viral Jaidip symptomatic care ? ?UC Treatments / Results  ?Labs ?(all labs ordered are listed, but only abnormal results are displayed) ?Labs Reviewed  ?GROUP A STREP BY PCR  ? ? ?EKG ? ? ?Radiology ?No results found. ? ?Procedures ?Procedures (including critical care time) ? ?Medications Ordered in UC ?Medications - No data to display ? ?Initial Impression / Assessment and Plan / UC Course  ?I have reviewed the triage vital signs and the nursing notes. ? ?Pertinent labs & imaging results that were available during my care of the patient were reviewed by  me and considered in my medical decision making (see chart for details). ? ?  ? ?Final Clinical Impressions(s) / UC Diagnoses  ? ?Final diagnoses:  ?Viral pharyngitis  ? ? ? ?  Discharge Instructions   ? ?  ?Try over the counter allergy medicines  ? ? ?ED Prescriptions   ?None ?  ? ?PDMP not reviewed this encounter. ?  ?Fransico Meadow, PA-C ?06/25/21 1557 ? ?

## 2021-06-28 ENCOUNTER — Other Ambulatory Visit: Payer: Self-pay

## 2021-06-28 ENCOUNTER — Emergency Department
Admission: EM | Admit: 2021-06-28 | Discharge: 2021-06-28 | Disposition: A | Discharge status: Home / Self Care | Payer: Medicaid Other | Attending: Emergency Medicine | Admitting: Emergency Medicine

## 2021-06-28 DIAGNOSIS — J029 Acute pharyngitis, unspecified: Secondary | ICD-10-CM | POA: Insufficient documentation

## 2021-06-28 LAB — URINALYSIS, COMPLETE (UACMP) WITH MICROSCOPIC
Bacteria, UA: NONE SEEN
Bilirubin Urine: NEGATIVE
Glucose, UA: NEGATIVE mg/dL
Hgb urine dipstick: NEGATIVE
Ketones, ur: NEGATIVE mg/dL
Nitrite: NEGATIVE
Protein, ur: NEGATIVE mg/dL
Specific Gravity, Urine: 1.024 (ref 1.005–1.030)
pH: 5 (ref 5.0–8.0)

## 2021-06-28 LAB — CHLAMYDIA/NGC RT PCR (ARMC ONLY)
Chlamydia Tr: NOT DETECTED
N gonorrhoeae: NOT DETECTED

## 2021-06-28 MED ORDER — FLUTICASONE PROPIONATE 50 MCG/ACT NA SUSP: 1.0000 | Freq: Every day | NASAL | 2 refills | Status: DC

## 2021-06-28 MED ORDER — LIDOCAINE VISCOUS HCL 2 % MT SOLN: 15.0000 mL | Freq: Four times a day (QID) | OROMUCOSAL | 0 refills | Status: AC | PRN

## 2021-06-28 MED ORDER — CETIRIZINE HCL 10 MG PO TABS: 10.0000 mg | ORAL_TABLET | Freq: Every day | ORAL | 0 refills | Status: DC

## 2021-06-28 NOTE — ED Provider Notes (Signed)
? ?[facility] ?Provider Note ? ?Patient Contact: 4:56 PM (approximate) ? ? ?History  ? ?Sore Throat ? ? ?HPI ? ?Christine Oconnor is a 26 y.o. female presents to the emergency department with persistent pharyngitis for the past 3 weeks.  Patient has been tested for both mono and group A strep by urgent care and tested negative.  Patient states that she does have sporadic cough and nasal congestion.  She states that she has not had similar instances of pharyngitis in years past with seasonal allergies.  She states that she has had unprotected oral sex within the past 6 months and has had a history of gonorrhea.  No chest pain, chest tightness or shortness of breath. ? ?  ? ? ?Physical Exam  ? ?Triage Vital Signs: ?ED Triage Vitals  ?Enc Vitals Group  ?   BP 06/28/21 1254 (!) 128/95  ?   Pulse Rate 06/28/21 1254 99  ?   Resp 06/28/21 1254 18  ?   Temp 06/28/21 1254 98.9 ?F (37.2 ?C)  ?   Temp Source 06/28/21 1254 Oral  ?   SpO2 06/28/21 1254 97 %  ?   Weight 06/28/21 1554 269 lb 13.5 oz (122.4 kg)  ?   Height 06/28/21 1554 5\' 6"  (1.676 m)  ?   Head Circumference --   ?   Peak Flow --   ?   Pain Score 06/28/21 1252 10  ?   Pain Loc --   ?   Pain Edu? --   ?   Excl. in GC? --   ? ? ?Most recent vital signs: ?Vitals:  ? 06/28/21 1254  ?BP: (!) 128/95  ?Pulse: 99  ?Resp: 18  ?Temp: 98.9 ?F (37.2 ?C)  ?SpO2: 97%  ? ? ? ?General: Alert and in no acute distress. ?Eyes:  PERRL. EOMI. ?Head: No acute traumatic findings ?ENT: ?     Ears: TMs are effused bilaterally. ?     Nose: No congestion/rhinnorhea. ?     Mouth/Throat: Mucous membranes are moist.  Posterior pharynx is mildly erythematous.  No significant tonsillar matter.  Uvula is midline. ?Neck: No stridor. No cervical spine tenderness to palpation. ?Hematological/Lymphatic/Immunilogical: Palpable cervical lymphadenopathy.  ?Cardiovascular:  Good peripheral perfusion ?Respiratory: Normal respiratory effort without tachypnea or retractions. Lungs  CTAB. Good air entry to the bases with no decreased or absent breath sounds. ?Gastrointestinal: Bowel sounds ?4 quadrants. Soft and nontender to palpation. No guarding or rigidity. No palpable masses. No distention. No CVA tenderness. ?Musculoskeletal: Full range of motion to all extremities.  ?Neurologic:  No gross focal neurologic deficits are appreciated.  ?Skin:   No rash noted ?Other: ? ? ?ED Results / Procedures / Treatments  ? ?Labs ?(all labs ordered are listed, but only abnormal results are displayed) ?Labs Reviewed  ?URINALYSIS, COMPLETE (UACMP) WITH MICROSCOPIC - Abnormal; Notable for the following components:  ?    Result Value  ? Color, Urine YELLOW (*)   ? APPearance HAZY (*)   ? Leukocytes,Ua TRACE (*)   ? All other components within normal limits  ?CHLAMYDIA/NGC RT PCR ([facility] ONLY)            ? ? ? ? ?PROCEDURES: ? ?Critical Care performed: No ? ?Procedures ? ? ?MEDICATIONS ORDERED IN ED: ?Medications - No data to display ? ? ?IMPRESSION / MDM / ASSESSMENT AND PLAN / ED COURSE  ?I reviewed the triage vital signs and the nursing notes. ?             ?               ? ?  Assessment and plan ?Pharyngitis ?26 year old female presents to the emergency department with persistent pharyngitis for the past 3 weeks. ? ?Vital signs are reassuring at triage.  On physical exam, patient was alert, active and nontoxic-appearing and managing her own secretions.  Will test for gonorrhea and chlamydia of the throat and will recommend Zyrtec and Flonase for eustachian tube dysfunction.  Patient was also prescribed viscous lidocaine to help with pharyngitis.  Return precautions were given to return with new or worsening symptoms. ?  ? ? ?FINAL CLINICAL IMPRESSION(S) / ED DIAGNOSES  ? ?Final diagnoses:  ?Pharyngitis, unspecified etiology  ? ? ? ?Rx / DC Orders  ? ?ED Discharge Orders   ? ?      Ordered  ?  cetirizine (ZYRTEC ALLERGY) 10 MG tablet  Daily       ? 06/28/21 1604  ?  fluticasone (FLONASE) 50 MCG/ACT nasal spray   Daily       ? 06/28/21 1604  ?  lidocaine (XYLOCAINE) 2 % solution  Every 6 hours PRN       ? 06/28/21 1604  ? ?  ?  ? ?  ? ? ? ?Note:  This document was prepared using Dragon voice recognition software and may include unintentional dictation errors. ?  ?Orvil Feil, PA-C ?06/28/21 1659 ? ?  ?Sharyn Creamer, MD ?06/28/21 2032 ? ?

## 2021-06-28 NOTE — Discharge Instructions (Addendum)
You can gargle with viscous lidocaine. ?Please use 1 spray of Flonase each side. ?Please take 10 mg of Zyrtec. ?

## 2021-06-28 NOTE — ED Triage Notes (Addendum)
Pt comes with c/o sore throat, neck pain for over three weeks. Pt states she has been tested for mono, covid and strep all neg results. Pt states it hurts to swallow and pain is getting worse. ? ?Pt also states some pain and burning with urination that started yesterday. ?

## 2022-01-12 ENCOUNTER — Ambulatory Visit (LOCAL_COMMUNITY_HEALTH_CENTER): Payer: Medicaid Other

## 2022-01-12 VITALS — BP 128/83 | Ht 66.0 in | Wt 262.5 lb

## 2022-01-12 DIAGNOSIS — Z3201 Encounter for pregnancy test, result positive: Secondary | ICD-10-CM | POA: Diagnosis not present

## 2022-01-12 LAB — PREGNANCY, URINE: Preg Test, Ur: POSITIVE — AB

## 2022-01-12 MED ORDER — PRENATAL 27-0.8 MG PO TABS: 1.0000 | ORAL_TABLET | Freq: Every day | ORAL | 0 refills | Status: AC

## 2022-01-12 NOTE — Progress Notes (Signed)
UPT positive. Plans prenatal care at [facility]. Has appt scheduled 01/20/2022. Pt explains she abruptly stopped metformin when found out preg. RN advised pt to contact prescribing provider regarding advisement related to pregnancy and recent discontinuation of metformin. Pt in agreement.   The patient was dispensed prenatal vitamins #100 today. I provided counseling today regarding the medication. We discussed the medication, the side effects and when to call clinic. Patient given the opportunity to ask questions. Questions answered.  Sent to DSS for medicaid / preg women. Josie Saunders, RN

## 2022-02-11 ENCOUNTER — Ambulatory Visit
Admission: EM | Admit: 2022-02-11 | Discharge: 2022-02-11 | Disposition: A | Discharge status: Home / Self Care | Payer: Medicaid Other | Attending: Emergency Medicine | Admitting: Emergency Medicine

## 2022-02-11 ENCOUNTER — Encounter: Payer: Self-pay | Admitting: Emergency Medicine

## 2022-02-11 DIAGNOSIS — B9789 Other viral agents as the cause of diseases classified elsewhere: Secondary | ICD-10-CM | POA: Diagnosis not present

## 2022-02-11 DIAGNOSIS — Z3A10 10 weeks gestation of pregnancy: Secondary | ICD-10-CM | POA: Insufficient documentation

## 2022-02-11 DIAGNOSIS — O99511 Diseases of the respiratory system complicating pregnancy, first trimester: Secondary | ICD-10-CM | POA: Diagnosis not present

## 2022-02-11 DIAGNOSIS — Z1152 Encounter for screening for COVID-19: Secondary | ICD-10-CM | POA: Diagnosis not present

## 2022-02-11 DIAGNOSIS — R519 Headache, unspecified: Secondary | ICD-10-CM | POA: Diagnosis present

## 2022-02-11 DIAGNOSIS — R0981 Nasal congestion: Secondary | ICD-10-CM | POA: Diagnosis present

## 2022-02-11 DIAGNOSIS — J019 Acute sinusitis, unspecified: Secondary | ICD-10-CM | POA: Diagnosis not present

## 2022-02-11 LAB — RESP PANEL BY RT-PCR (FLU A&B, COVID) ARPGX2
Influenza A by PCR: NEGATIVE
Influenza B by PCR: NEGATIVE
SARS Coronavirus 2 by RT PCR: NEGATIVE

## 2022-02-11 NOTE — ED Provider Notes (Signed)
[facility]    CSN: [id number] Arrival date & time: 02/11/22  1518      History   Chief Complaint Chief Complaint  Patient presents with   Sinus Problem   Headache    HPI Christine Oconnor is a 26 y.o. female.   HPI   Christine Oconnor presents for persistent headache with nasal congestion. It feels like its clogged and with a lot of pressure. She was not able to sleep last night due to the pressure. A few days ago she had a weird drainage down her throat. She has been sneezing but not coughing. No fever, rhinorrhea, vomiting or diarrhea. She took some Tylenol without relief.   She is pregnant (~10 wks).  She is not having any vaginal bleeding or abdominal pain.  She works from home.       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   Pregnancy with uncertain fetal viability 06/23/2021   Threatened abortion in first trimester 06/23/2021   Preeclampsia, severe, third trimester 10/13/2020   Gestational diabetes mellitus (GDM) in third trimester 08/12/2020   Excessive weight gain during pregnancy in second trimester 07/14/2020   History of back surgery 04/14/2020   Obesity 03/31/2020   Depression during pregnancy in first trimester 03/31/2020   Immunization counseling 03/31/2020   High-risk pregnancy 03/06/2020   PCOS (polycystic ovarian syndrome) 12/15/2016    Past Surgical History:  Procedure Laterality Date   ANKLE SURGERY     BACK SURGERY     CESAREAN SECTION  10/14/2020   preeclampsia    OB History     Gravida  3   Para  1   Term  0   Preterm  1   AB  1   Living  1      SAB  1   IAB  0   Ectopic  0   Multiple  0   Live Births  1            Home Medications    Prior to Admission medications   Medication Sig Start Date End Date  Taking? Authorizing Provider  Prenatal Vit-Fe Fumarate-FA (MULTIVITAMIN-PRENATAL) 27-0.8 MG TABS tablet Take 1 tablet by mouth daily at 12 noon. 01/12/22 04/22/22 Yes Caren Macadam, MD  acetaminophen (TYLENOL) 325 MG tablet Take by mouth. Patient not taking: Reported on 01/12/2022 10/16/20   [provider]  Blood Pressure Monitoring (ADULT BLOOD PRESSURE CUFF LG) KIT Check blood pressure as indicated by your healthcare provider 10/16/20   [provider]  cetirizine (ZYRTEC ALLERGY) 10 MG tablet Take 1 tablet (10 mg total) by mouth daily for 7 days. 06/28/21 07/05/21  Lannie Fields, PA-C  fluticasone (FLONASE) 50 MCG/ACT nasal spray Place 1 spray into both nostrils daily. Patient not taking: Reported on 01/12/2022 06/28/21 06/28/22  Lannie Fields, PA-C  ibuprofen (ADVIL) 600 MG tablet Take by mouth. Patient not taking: Reported on 01/12/2022 10/16/20   [provider]  levonorgestrel-ethinyl estradiol (ALESSE) 0.1-20 MG-MCG tablet Take 1 tablet by mouth daily. 12/29/20 12/29/21  [provider]  metFORMIN (GLUCOPHAGE) 500 MG tablet Take 1 tablet by mouth daily with breakfast. Patient not taking: Reported on 01/12/2022 03/30/21   [provider]  Multiple Vitamin (MULTIVITAMIN PO) Take 1 tablet by mouth  daily. Patient not taking: Reported on 01/12/2022    [provider]  NIFEdipine (PROCARDIA-XL/NIFEDICAL-XL) 30 MG 24 hr tablet Take by mouth. 10/17/20 10/17/21  [provider]  phentermine 15 MG capsule Take by mouth. 03/30/21 06/23/21  [provider]  topiramate (TOPAMAX) 25 MG tablet Take 25 mg by mouth 2 (two) times daily. Patient not taking: Reported on 01/12/2022 05/04/21   [provider]    Family History History reviewed. No pertinent family history.  Social History Social History   Tobacco Use   Smoking status: Never   Smokeless tobacco: Never  Vaping Use   Vaping Use: Never used  Substance Use Topics    Alcohol use: Not Currently    Alcohol/week: 1.0 standard drink of alcohol    Types: 1 Standard drinks or equivalent per week    Comment: last use 10/2021   Drug use: Not Currently    Frequency: 7.0 times per week    Types: Marijuana    Comment: smokes marijuana daily- last use 02/22/20     Allergies   Nifedipine   Review of Systems Review of Systems: negative unless otherwise stated in HPI.      Physical Exam Triage Vital Signs ED Triage Vitals  Enc Vitals Group     BP 02/11/22 1642 120/82     Pulse Rate 02/11/22 1642 83     Resp 02/11/22 1642 14     Temp 02/11/22 1642 98.8 F (37.1 C)     Temp Source 02/11/22 1642 Oral     SpO2 02/11/22 1642 100 %     Weight 02/11/22 1639 262 lb 9.1 oz (119.1 kg)     Height 02/11/22 1639 _0  (1.676 m)     Head Circumference --      Peak Flow --      Pain Score 02/11/22 1639 8     Pain Loc --      Pain Edu? --      Excl. in [place]? --    No data found.  Updated Vital Signs BP 120/82 (BP Location: Left Arm)   Pulse 83   Temp 98.8 F (37.1 C) (Oral)   Resp 14   Ht _1  (1.676 m)   Wt 119.1 kg   LMP 11/30/2021   SpO2 100%   Breastfeeding No   BMI 42.38 kg/m   Visual Acuity Right Eye Distance:   Left Eye Distance:   Bilateral Distance:    Right Eye Near:   Left Eye Near:    Bilateral Near:     Physical Exam GEN:     alert, non-toxic appearing female in no distress    HENT:  mucus membranes moist, oropharyngeal without lesions or exudate, no  tonsillar hypertrophy,   mild oropharyngeal erythema ,   moderate erythematous edematous turbinates,  clear nasal discharge,  bilateral TM  normal EYES:   pupils equal and reactive, EOMi ,  no scleral injection NECK:  normal ROM, no lymphadenopathy,  no meningismus   RESP:  no increased work of breathing,  clear to auscultation bilaterally CVS:   regular rate  and rhythm Skin:   warm and dry, no rash on visible skin    UC Treatments / Results  Labs (all labs ordered are  listed, but only abnormal results are displayed) Labs Reviewed  RESP PANEL BY RT-PCR (FLU A&B, COVID) [id number]    EKG   Radiology No results found.  Procedures Procedures (including critical care time)  Medications Ordered in  UC Medications - No data to display  Initial Impression / Assessment and Plan / UC Course  I have reviewed the triage vital signs and the nursing notes.  Pertinent labs & imaging results that were available during my care of the patient were reviewed by me and considered in my medical decision making (see chart for details).       Pt is a 26 y.o. female who is about [redacted] weeks pregnant presents for 2 days of respiratory symptoms. [place] is afebrile here without recent antipyretics. Satting well on room air. Overall pt is non-toxic appearing, well hydrated, without respiratory distress. Pulmonary exam is unremarkable.  COVID and influenza testing obtained and were negative. Pt's daughter has similar symptoms. History consistent with viral respiratory illness. Discussed symptomatic treatment that is pregnancy safe.  Explained lack of efficacy of antibiotics in viral disease.  Typical duration of symptoms discussed. Return and ED precautions given and patient voiced understanding.   Discussed MDM, treatment plan and plan for follow-up with patient who agrees with plan.     Final Clinical Impressions(s) / UC Diagnoses   Final diagnoses:  Acute viral sinusitis     Discharge Instructions      Your COVID and influenza tests are negative. Take Tylenol 1000 mg three times a day as needed for headache.  Use nasal saline as needed for nasal congestion.       ED Prescriptions   None    PDMP not reviewed this encounter.   Lyndee Hensen, DO 02/12/22 [phone]

## 2022-02-11 NOTE — ED Triage Notes (Signed)
Patient c/o sinus congestion and pressure and headache that started yesterday.  Patient had negative home covid test today. Patient denies fevers.

## 2022-02-11 NOTE — Discharge Instructions (Addendum)
Your COVID and influenza tests are negative. Take Tylenol 1000 mg three times a day as needed for headache.  Use nasal saline as needed for nasal congestion.

## 2022-06-27 DIAGNOSIS — K8020 Calculus of gallbladder without cholecystitis without obstruction: Secondary | ICD-10-CM | POA: Insufficient documentation

## 2023-03-14 ENCOUNTER — Ambulatory Visit: Payer: Self-pay

## 2023-03-14 ENCOUNTER — Ambulatory Visit
Admission: RE | Admit: 2023-03-14 | Discharge: 2023-03-14 | Disposition: A | Discharge status: Home / Self Care | Payer: Medicaid Other | Source: Ambulatory Visit | Attending: Physician Assistant | Admitting: Physician Assistant

## 2023-03-14 VITALS — BP 113/83 | HR 70 | Temp 99.3°F | Resp 16 | Ht 66.0 in | Wt 290.0 lb

## 2023-03-14 DIAGNOSIS — M545 Low back pain, unspecified: Secondary | ICD-10-CM | POA: Diagnosis not present

## 2023-03-14 DIAGNOSIS — G8929 Other chronic pain: Secondary | ICD-10-CM | POA: Diagnosis not present

## 2023-03-14 MED ORDER — KETOROLAC TROMETHAMINE 30 MG/ML IJ SOLN
30.0000 mg | Freq: Once | INTRAMUSCULAR | Status: AC
  Administered 2023-03-14: 30 mg via INTRAMUSCULAR

## 2023-03-14 NOTE — ED Triage Notes (Signed)
Pt c/o back pain x1 mon. Hx of sciatica & surgery.

## 2023-03-14 NOTE — ED Provider Notes (Signed)
[facility]    CSN: [id number] Arrival date & time: 03/14/23  1044      History   Chief Complaint Chief Complaint  Patient presents with   Back Pain    Appt    HPI Christine Oconnor is a 27 y.o. female with history of chronic back pain and degenerative disc disease of lumbar region.  She presents today complaining of a flareup of her underlying back condition for the past 1 month.  She reports pain all the way across the lower back.  Reports intermittent sharp and shooting pains into her right leg.  Has taken OTC meds without relief.  She does report that she has taken Toradol in the past via injection and it has been very helpful.  She requests a Toradol injection today.  She does have an appointment scheduled to see her PCP at the end of next month but did not want to wait.  She reports having 3 back surgeries with the first 1 being in 2014.  States she has actually been doing okay for the past couple of years without any flareups.  She denies any leg weakness, numbness/tingling, loss of bowel or bladder control.  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   Pregnancy with uncertain fetal viability 06/23/2021   Threatened abortion in first trimester 06/23/2021   Preeclampsia, severe, third trimester 10/13/2020   Gestational diabetes mellitus (GDM) in third trimester 08/12/2020   Excessive weight gain during pregnancy in second trimester 07/14/2020   History of back surgery 04/14/2020   Obesity 03/31/2020   Depression during pregnancy in first trimester 03/31/2020   Immunization counseling 03/31/2020   High-risk pregnancy 03/06/2020   PCOS (polycystic ovarian syndrome) 12/15/2016    Past Surgical History:  Procedure Laterality Date   ANKLE SURGERY     BACK  SURGERY     CESAREAN SECTION  10/14/2020   preeclampsia    OB History     Gravida  3   Para  1   Term  0   Preterm  1   AB  1   Living  1      SAB  1   IAB  0   Ectopic  0   Multiple  0   Live Births  1            Home Medications    Prior to Admission medications   Medication Sig Start Date End Date Taking? Authorizing Provider  acetaminophen (TYLENOL) 325 MG tablet Take by mouth. Patient not taking: Reported on 01/12/2022 10/16/20   [provider]  Blood Pressure Monitoring (ADULT BLOOD PRESSURE CUFF LG) KIT Check blood pressure as indicated by your healthcare provider 10/16/20   [provider]  cetirizine (ZYRTEC ALLERGY) 10 MG tablet Take 1 tablet (10 mg total) by mouth daily for 7 days. 06/28/21 07/05/21  Orvil Feil, PA-C  fluticasone (FLONASE) 50 MCG/ACT nasal spray Place 1 spray into both nostrils daily. Patient not taking: Reported on 01/12/2022 06/28/21 06/28/22  Orvil Feil, PA-C  ibuprofen (ADVIL) 600 MG tablet Take by mouth. Patient not taking: Reported on 01/12/2022 10/16/20   [provider]  levonorgestrel-ethinyl estradiol (ALESSE) 0.1-20 MG-MCG tablet Take 1 tablet by mouth daily. 12/29/20 12/29/21  [provider]  metFORMIN (GLUCOPHAGE) 500 MG tablet Take 1 tablet by mouth daily with breakfast. Patient not taking: Reported on 01/12/2022 03/30/21   [provider]  Multiple Vitamin (MULTIVITAMIN PO) Take 1 tablet by mouth daily. Patient not taking: Reported on 01/12/2022    [provider]  NIFEdipine (PROCARDIA-XL/NIFEDICAL-XL) 30 MG 24 hr tablet Take by mouth. 10/17/20 10/17/21  [provider]  phentermine 15 MG capsule Take by mouth. 03/30/21 06/23/21  [provider]  topiramate (TOPAMAX) 25 MG tablet Take 25 mg by mouth 2 (two) times daily. Patient not taking: Reported on 01/12/2022 05/04/21   [provider]    Family History History reviewed. No pertinent  family history.  Social History Social History   Tobacco Use   Smoking status: Never   Smokeless tobacco: Never  Vaping Use   Vaping status: Never Used  Substance Use Topics   Alcohol use: Not Currently    Alcohol/week: 1.0 standard drink of alcohol    Types: 1 Standard drinks or equivalent per week    Comment: last use 10/2021   Drug use: Not Currently    Frequency: 7.0 times per week    Types: Marijuana    Comment: smokes marijuana daily- last use 02/22/20     Allergies   Nifedipine   Review of Systems Review of Systems  Musculoskeletal:  Positive for back pain. Negative for arthralgias and gait problem.  Neurological:  Negative for weakness and numbness.     Physical Exam Triage Vital Signs ED Triage Vitals  Encounter Vitals Group     BP 03/14/23 1104 113/83     Systolic BP Percentile --      Diastolic BP Percentile --      Pulse Rate 03/14/23 1104 70     Resp 03/14/23 1104 16     Temp 03/14/23 1104 99.3 F (37.4 C)     Temp Source 03/14/23 1104 Oral     SpO2 03/14/23 1104 100 %     Weight 03/14/23 1112 290 lb (131.5 kg)     Height 03/14/23 1112 5\' 6"  (1.676 m)     Head Circumference --      Peak Flow --      Pain Score 03/14/23 1112 5     Pain Loc --      Pain Education --      Exclude from Growth Chart --    No data found.  Updated Vital Signs BP 113/83 (BP Location: Left Arm)   Pulse 70   Temp 99.3 F (37.4 C) (Oral)   Resp 16   Ht 5\' 6"  (1.676 m)   Wt 290 lb (131.5 kg)   SpO2 100%   BMI 46.81 kg/m      Physical Exam Vitals and nursing note reviewed.  Constitutional:      General: She is not in acute distress.    Appearance: Normal appearance. She is not ill-appearing or toxic-appearing.  HENT:     Head: Normocephalic and atraumatic.  Eyes:     General: No scleral icterus.       Right eye: No discharge.        Left eye: No discharge.     Conjunctiva/sclera: Conjunctivae normal.  Cardiovascular:     Rate and Rhythm: Normal rate.   Pulmonary:     Effort: Pulmonary effort is normal. No respiratory distress.  Musculoskeletal:     Cervical back: Neck supple.     Lumbar back: Tenderness (diffusely across lumbar region, moreso  of the L3-L4 and L4-L5 region) present. Decreased range of motion. Negative right straight leg raise test and negative left straight leg raise test.  Skin:    General: Skin is dry.  Neurological:     General: No focal deficit present.     Mental Status: She is alert. Mental status is at baseline.     Motor: No weakness.     Gait: Gait normal.  Psychiatric:        Mood and Affect: Mood normal.        Behavior: Behavior normal.      UC Treatments / Results  Labs (all labs ordered are listed, but only abnormal results are displayed) Labs Reviewed - No data to display  EKG   Radiology No results found.  Procedures Procedures (including critical care time)  Medications Ordered in UC Medications  ketorolac (TORADOL) 30 MG/ML injection 30 mg (30 mg Intramuscular Given 03/14/23 1134)    Initial Impression / Assessment and Plan / UC Course  I have reviewed the triage vital signs and the nursing notes.  Pertinent labs & imaging results that were available during my care of the patient were reviewed by me and considered in my medical decision making (see chart for details).   28 year old female with history of chronic back pain and 3 previous back surgeries presents for acute flareup of chronic underlying condition.  For the past month she has been having pain across the lower back with occasional sharp and shooting pains into the right leg.  Has tried OTC meds.  Request a Toradol injection at this time.  Has appointment scheduled with PCP next month.  Vitals are all normal and stable and she is overall well-appearing.  She has generalized tenderness throughout the lumbar region but more so along the spine.  Reduced range of motion.  Negative straight leg raise.  Symptoms are consistent  with acute flare of chronic underlying condition/lumbar radiculopathy.  Patient given 30 mg IM ketorolac in clinic for acute pain relief.  Advised naproxen and Tylenol at home, stretches, heat, ice and follow-up with PCP.  Reviewed return and ER precautions for back pain.   Final Clinical Impressions(s) / UC Diagnoses   Final diagnoses:  Chronic right-sided low back pain, unspecified whether sciatica present     Discharge Instructions      BACK PAIN: Stressed avoiding painful activities . RICE (REST, ICE, COMPRESSION, ELEVATION) guidelines reviewed. May alternate ice and heat. Consider use of muscle rubs, Salonpas patches, etc. Use medications as directed including muscle relaxers if prescribed. Take anti-inflammatory medications as prescribed or OTC NSAIDs/Tylenol.  F/u with PCP in 7-10 days for reexamination, and please feel free to call or return to the urgent care at any time for any questions or concerns you may have and we will be happy to help you!   BACK PAIN RED FLAGS: If the back pain acutely worsens or there are any red flag symptoms such as numbness/tingling, leg weakness, saddle anesthesia, or loss of bowel/bladder control, go immediately to the ER. Follow up with [place] as scheduled or sooner if the pain does not begin to resolve or if it worsens before the follow up      ED Prescriptions   None    PDMP not reviewed this encounter.   Shirlee Latch, PA-C 03/14/23 1144

## 2023-03-14 NOTE — Discharge Instructions (Signed)
BACK PAIN: Stressed avoiding painful activities . RICE (REST, ICE, COMPRESSION, ELEVATION) guidelines reviewed. May alternate ice and heat. Consider use of muscle rubs, Salonpas patches, etc. Use medications as directed including muscle relaxers if prescribed. Take anti-inflammatory medications as prescribed or OTC NSAIDs/Tylenol.  F/u with PCP in 7-10 days for reexamination, and please feel free to call or return to the urgent care at any time for any questions or concerns you may have and we will be happy to help you!   BACK PAIN RED FLAGS: If the back pain acutely worsens or there are any red flag symptoms such as numbness/tingling, leg weakness, saddle anesthesia, or loss of bowel/bladder control, go immediately to the ER. Follow up with us as scheduled or sooner if the pain does not begin to resolve or if it worsens before the follow up   

## 2023-07-01 ENCOUNTER — Ambulatory Visit
Admission: RE | Admit: 2023-07-01 | Discharge: 2023-07-01 | Disposition: A | Discharge status: Home / Self Care | Payer: Self-pay | Source: Ambulatory Visit | Attending: Family Medicine

## 2023-07-01 ENCOUNTER — Ambulatory Visit (INDEPENDENT_AMBULATORY_CARE_PROVIDER_SITE_OTHER)

## 2023-07-01 VITALS — BP 144/93 | HR 79 | Temp 97.6°F | Resp 17

## 2023-07-01 DIAGNOSIS — M25571 Pain in right ankle and joints of right foot: Secondary | ICD-10-CM

## 2023-07-01 NOTE — ED Triage Notes (Signed)
 Patient states that she's having pain in her right ankle for 5 days. Hx of surgery. Patient states that when she walks the it hurts but gets worse when she takes weight off of the foot.

## 2023-07-01 NOTE — ED Provider Notes (Signed)
 [facility]    CSN: [id number] Arrival date & time: 07/01/23  1324      History   Chief Complaint Chief Complaint  Patient presents with   Ankle Pain    Entered by patient    HPI Christine Oconnor is a 28 y.o. female presents for ankle pain.  Patient reports 5 days of an intermittent anterior right ankle pain that is worse when she rest and better with weightbearing.  States she has a history of right ankle fracture status post surgeries with screws several years ago.  She states she did of having to have the screws removed and was told that they had broken and there may be pieces of the screws remaining.  She states ankles been pretty swollen since the initial injury but denies any additional swelling, bruising, numbness or tingling.  No recent injury or known inciting event.  She tried to do an overnight ankle brace without improvement.  No other concerns at this time.   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   Pregnancy with uncertain fetal viability 06/23/2021   Threatened abortion in first trimester 06/23/2021   Preeclampsia, severe, third trimester 10/13/2020   Gestational diabetes mellitus (GDM) in third trimester 08/12/2020   Excessive weight gain during pregnancy in second trimester 07/14/2020   History of back surgery 04/14/2020   Obesity 03/31/2020   Depression during pregnancy in first trimester 03/31/2020   Immunization counseling 03/31/2020   High-risk pregnancy 03/06/2020   PCOS (polycystic ovarian syndrome) 12/15/2016    Past Surgical History:  Procedure Laterality Date   ANKLE SURGERY     BACK SURGERY     CESAREAN SECTION  10/14/2020   preeclampsia    OB History     Gravida  3   Para  1   Term  0   Preterm  1   AB  1    Living  1      SAB  1   IAB  0   Ectopic  0   Multiple  0   Live Births  1            Home Medications    Prior to Admission medications   Medication Sig Start Date End Date Taking? Authorizing Provider  acetaminophen (TYLENOL) 325 MG tablet Take by mouth. Patient not taking: Reported on 01/12/2022 10/16/20   [provider]  Blood Pressure Monitoring (ADULT BLOOD PRESSURE CUFF LG) KIT Check blood pressure as indicated by your healthcare provider 10/16/20   [provider]  cetirizine (ZYRTEC ALLERGY) 10 MG tablet Take 1 tablet (10 mg total) by mouth daily for 7 days. 06/28/21 07/05/21  Orvil Feil, PA-C  fluticasone (FLONASE) 50 MCG/ACT nasal spray Place 1 spray into both nostrils daily. Patient not taking: Reported on 01/12/2022 06/28/21 06/28/22  Orvil Feil, PA-C  ibuprofen (ADVIL) 600 MG tablet Take by mouth. Patient not taking: Reported on 01/12/2022 10/16/20   [provider]  levonorgestrel-ethinyl estradiol (ALESSE) 0.1-20 MG-MCG tablet Take 1 tablet by mouth daily. 12/29/20 12/29/21  [provider]  metFORMIN (GLUCOPHAGE) 500 MG tablet Take 1 tablet by mouth daily with breakfast. Patient not taking: Reported on 01/12/2022 03/30/21   [provider]  Multiple Vitamin (MULTIVITAMIN  PO) Take 1 tablet by mouth daily. Patient not taking: Reported on 01/12/2022    [provider]  NIFEdipine (PROCARDIA-XL/NIFEDICAL-XL) 30 MG 24 hr tablet Take by mouth. 10/17/20 10/17/21  [provider]  phentermine 15 MG capsule Take by mouth. 03/30/21 06/23/21  [provider]  topiramate (TOPAMAX) 25 MG tablet Take 25 mg by mouth 2 (two) times daily. Patient not taking: Reported on 01/12/2022 05/04/21   [provider]    Family History History reviewed. No pertinent family history.  Social History Social History   Tobacco Use   Smoking status: Never   Smokeless tobacco: Never  Vaping Use   Vaping  status: Never Used  Substance Use Topics   Alcohol use: Not Currently    Alcohol/week: 1.0 standard drink of alcohol    Types: 1 Standard drinks or equivalent per week    Comment: last use 10/2021   Drug use: Not Currently    Frequency: 7.0 times per week    Types: Marijuana    Comment: smokes marijuana daily- last use 02/22/20     Allergies   Nifedipine   Review of Systems Review of Systems  Musculoskeletal:        Right ankle pain       Physical Exam Triage Vital Signs ED Triage Vitals  Encounter Vitals Group     BP 07/01/23 1338 (!) 144/93     Systolic BP Percentile --      Diastolic BP Percentile --      Pulse Rate 07/01/23 1338 79     Resp 07/01/23 1338 17     Temp 07/01/23 1338 97.6 F (36.4 C)     Temp Source 07/01/23 1338 Oral     SpO2 07/01/23 1338 97 %     Weight --      Height --      Head Circumference --      Peak Flow --      Pain Score 07/01/23 1337 7     Pain Loc --      Pain Education --      Exclude from Growth Chart --    No data found.  Updated Vital Signs BP (!) 144/93 (BP Location: Left Wrist)   Pulse 79   Temp 97.6 F (36.4 C) (Oral)   Resp 17   SpO2 97%   Visual Acuity Right Eye Distance:   Left Eye Distance:   Bilateral Distance:    Right Eye Near:   Left Eye Near:    Bilateral Near:     Physical Exam Vitals and nursing note reviewed.  Constitutional:      General: She is not in acute distress.    Appearance: Normal appearance. She is not ill-appearing.  HENT:     Head: Normocephalic and atraumatic.  Eyes:     Pupils: Pupils are equal, round, and reactive to light.  Cardiovascular:     Rate and Rhythm: Normal rate.  Pulmonary:     Effort: Pulmonary effort is normal.  Musculoskeletal:       Legs:     Comments: There is mild swelling of the right ankle with tenderness palpation to the anterior lateral aspect of the ankle.  There is no tenderness with palpation to medial or lateral malleolus or to dorsum of foot.   Pain with dorsiflexion and extension of the foot.  DP +2.  There are healed surgical scars noted anteriorly and medially.  Skin:    General: Skin is warm.  Neurological:     General: No focal deficit present.     Mental Status: She is alert and oriented to person, place, and time.  Psychiatric:        Mood and Affect: Mood normal.        Behavior: Behavior normal.      UC Treatments / Results  Labs (all labs ordered are listed, but only abnormal results are displayed) Labs Reviewed - No data to display  EKG   Radiology No results found.  Procedures Procedures (including critical care time)  Medications Ordered in UC Medications - No data to display  Initial Impression / Assessment and Plan / UC Course  I have reviewed the triage vital signs and the nursing notes.  Pertinent labs & imaging results that were available during my care of the patient were reviewed by me and considered in my medical decision making (see chart for details).     Reviewed exam and symptoms with patient.  No red flags.  Wet read of x-ray without obvious fracture, compared with previous x-rays residual hardware in place.  Does show some arthritis.  Discussed this is probable cause of symptoms.  Reviewed RICE therapy and Ace wrap applied in clinic.  She will use over-the-counter NSAIDs as needed.  She was instructed to follow-up with PCP if symptoms do not improve.  ER precautions reviewed. Final Clinical Impressions(s) / UC Diagnoses   Final diagnoses:  Acute right ankle pain     Discharge Instructions      Use Ace wrap to the ankle to help support the joint as well as help with swelling.  Elevate and ice as needed.  You may take over-the-counter ibuprofen or naproxen as needed for pain and inflammation.  Please follow-up with your PCP if your symptoms do not improve.  Please go to the ER for any worsening symptoms.  Hope you feel better soon!     ED Prescriptions   None    PDMP not  reviewed this encounter.   Radford Pax, NP 07/01/23 1500

## 2023-07-01 NOTE — Discharge Instructions (Addendum)
 Use Ace wrap to the ankle to help support the joint as well as help with swelling.  Elevate and ice as needed.  You may take over-the-counter ibuprofen or naproxen as needed for pain and inflammation.  Please follow-up with your PCP if your symptoms do not improve.  Please go to the ER for any worsening symptoms.  Hope you feel better soon!

## 2023-10-22 ENCOUNTER — Ambulatory Visit
Admission: RE | Admit: 2023-10-22 | Discharge: 2023-10-22 | Disposition: A | Discharge status: Home / Self Care | Payer: Self-pay | Source: Ambulatory Visit

## 2023-10-22 VITALS — BP 134/94 | HR 78 | Temp 98.6°F | Resp 16 | Ht 66.0 in | Wt 289.9 lb

## 2023-10-22 DIAGNOSIS — R21 Rash and other nonspecific skin eruption: Secondary | ICD-10-CM | POA: Diagnosis not present

## 2023-10-22 DIAGNOSIS — L929 Granulomatous disorder of the skin and subcutaneous tissue, unspecified: Secondary | ICD-10-CM

## 2023-10-22 MED ORDER — CLOTRIMAZOLE 1 % EX CREA: TOPICAL_CREAM | CUTANEOUS | 0 refills | Status: DC

## 2023-10-22 MED ORDER — CEPHALEXIN 500 MG PO CAPS: 500.0000 mg | ORAL_CAPSULE | Freq: Three times a day (TID) | ORAL | 0 refills | Status: AC

## 2023-10-22 NOTE — ED Provider Notes (Signed)
 [facility]    CSN: [id number] Arrival date & time: 10/22/23  1058      History   Chief Complaint Chief Complaint  Patient presents with   Abdominal Pain    HPI Christine Oconnor is a 28 y.o. female.   HPI  28 year old female with past medical history of anemia, PCOS, gestational hypertension, chronic low back pain status post lumbar laminectomy presents for evaluation of bleeding from her bellybutton that she noticed yesterday.  No fever.  She does report the area is red and tender.  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   Pregnancy with uncertain fetal viability 06/23/2021   Threatened abortion in first trimester 06/23/2021   Preeclampsia, severe, third trimester 10/13/2020   Gestational diabetes mellitus (GDM) in third trimester 08/12/2020   Excessive weight gain during pregnancy in second trimester 07/14/2020   History of back surgery 04/14/2020   Obesity 03/31/2020   Depression during pregnancy in first trimester 03/31/2020   Immunization counseling 03/31/2020   High-risk pregnancy 03/06/2020   PCOS (polycystic ovarian syndrome) 12/15/2016    Past Surgical History:  Procedure Laterality Date   ANKLE SURGERY     BACK SURGERY     CESAREAN SECTION  10/14/2020   preeclampsia    OB History     Gravida  3   Para  1   Term  0   Preterm  1   AB  1   Living  1      SAB  1   IAB  0   Ectopic  0   Multiple  0   Live Births  1            Home Medications    Prior to Admission medications   Medication Sig Start Date End Date Taking? Authorizing Provider  cephALEXin  (KEFLEX ) 500 MG capsule Take 1 capsule (500 mg total) by mouth 3 (three) times daily for 7 days. 10/22/23 10/29/23 Yes Bernardino Ditch, NP  clotrimazole  (LOTRIMIN ) 1 % cream  Apply to affected area 2 times daily 10/22/23  Yes Anthonella Klausner, Ditch, NP  WEGOVY 2.4 MG/0.75ML SOAJ INJECT 2.4 MG SUBCUTANEOUSLY  ONCE A WEEK 07/20/23  Yes [provider]  cetirizine  (ZYRTEC  ALLERGY) 10 MG tablet Take 1 tablet (10 mg total) by mouth daily for 7 days. 06/28/21 07/05/21  Woods, Jaclyn M, PA-C  DULoxetine (CYMBALTA) 20 MG capsule Take 20 mg by mouth daily.    [provider]    Family History History reviewed. No pertinent family history.  Social History Social History   Tobacco Use   Smoking status: Never   Smokeless tobacco: Never  Vaping Use   Vaping status: Never Used  Substance Use Topics   Alcohol use: Not Currently    Alcohol/week: 1.0 standard drink of alcohol    Types: 1 Standard drinks or equivalent per week    Comment: last use 10/2021   Drug use: Not Currently    Frequency: 7.0 times per week    Types: Marijuana    Comment: smokes marijuana daily- last use 02/22/20     Allergies   Nifedipine   Review of Systems Review of Systems  Constitutional:  Negative for fever.  Skin:  Positive for color change and rash. Negative for wound.  Physical Exam Triage Vital Signs ED Triage Vitals  Encounter Vitals Group     BP      Girls Systolic BP Percentile      Girls Diastolic BP Percentile      Boys Systolic BP Percentile      Boys Diastolic BP Percentile      Pulse      Resp      Temp      Temp src      SpO2      Weight      Height      Head Circumference      Peak Flow      Pain Score      Pain Loc      Pain Education      Exclude from Growth Chart    No data found.  Updated Vital Signs BP (!) 134/94 (BP Location: Right Arm)   Pulse 78   Temp 98.6 F (37 C) (Oral)   Resp 16   Ht 5' 6 (1.676 m)   Wt 289 lb 14.5 oz (131.5 kg)   LMP 10/13/2023 (Approximate)   SpO2 96%   Breastfeeding No   BMI 46.79 kg/m   Visual Acuity Right Eye Distance:   Left Eye Distance:   Bilateral Distance:    Right Eye Near:   Left  Eye Near:    Bilateral Near:     Physical Exam Vitals and nursing note reviewed.  Constitutional:      Appearance: Normal appearance. She is not ill-appearing.  HENT:     Head: Normocephalic and atraumatic.  Skin:    General: Skin is warm and dry.     Capillary Refill: Capillary refill takes less than 2 seconds.     Findings: Erythema present.  Neurological:     General: No focal deficit present.     Mental Status: She is alert and oriented to person, place, and time.      UC Treatments / Results  Labs (all labs ordered are listed, but only abnormal results are displayed) Labs Reviewed - No data to display  EKG   Radiology No results found.  Procedures Procedures (including critical care time)  Medications Ordered in UC Medications - No data to display  Initial Impression / Assessment and Plan / UC Course  I have reviewed the triage vital signs and the nursing notes.  Pertinent labs & imaging results that were available during my care of the patient were reviewed by me and considered in my medical decision making (see chart for details).   Patient is a pleasant, nontoxic-appearing 28 year old female presenting for evaluation of integumentary complaints as outlined in the HPI above.  Her prior complaint is that yesterday she had some bleeding from her bellybutton.  She also describes noticing that the area was red and she states that the area is slightly sore.  As you can see the image above, there is some erythema around the rim of the umbilicus.  I did explore to the depth of the umbilicus using a cotton-tipped applicator and there is no bloody discharge and there is no erythema deeper in the umbilicus.  The area of erythema is also scaly.  No appreciable discharge.  The patient reports that it is tender to touch.  I cannot appreciate whether or not it is warm given the small nature.  I will treat her for potentially bacterial and fungal sources.  The patient is also  complaining about a knot underneath her  pannus that has been present for almost a year.  She reports that it appeared, she squeezed it which caused bruising, and then went to her PCP who thought it might be a hernia.  She was referred to an ultrasound but did not have the ultrasound completed.  The knot in question is in the image above.  It is a pea-sized area of erythematous and indurated tissue that is surrounding a hair follicle.  I suspect that this is a granuloma that has formed around previous folliculitis.  I will discharge patient home with diagnosis of nonspecific skin eruption in her umbilicus and start her on Keflex  500 mg 3 times daily along with clotrimazole  twice daily to cover for both bacterial and infectious sources.   Final Clinical Impressions(s) / UC Diagnoses   Final diagnoses:  Rash and nonspecific skin eruption  Granuloma of skin     Discharge Instructions      Take the Keflex  500 mg 3 times a day with food for 7 days to treat potential bacterial sources of the redness in your bellybutton.  Using a clean Q-tip for each application apply a small amount of clotrimazole  to the red area in your bellybutton twice daily for the next week.  You may use over-the-counter Tylenol and/or ibuprofen  Corda the pack instructions as needed for any discomfort you may experience.  If the area becomes more red, hot, swollen, you develop pus drainage, or you start running fevers either return for reevaluation or follow-up with your primary care provider.     ED Prescriptions     Medication Sig Dispense Auth. Provider   cephALEXin  (KEFLEX ) 500 MG capsule Take 1 capsule (500 mg total) by mouth 3 (three) times daily for 7 days. 21 capsule Bernardino Ditch, NP   clotrimazole  (LOTRIMIN ) 1 % cream Apply to affected area 2 times daily 15 g Bernardino Ditch, NP      PDMP not reviewed this encounter.   Bernardino Ditch, NP 10/22/23 1134

## 2023-10-22 NOTE — ED Triage Notes (Addendum)
 Pt c/o a knot in pelvic area. She states she was her PCP and was suppose to have a U/S but never did. She states she feels like this was over a year ago. She states yesterday she has bright red blood coming from her belly button.  She is concerned this is a hernia.

## 2023-10-22 NOTE — Discharge Instructions (Signed)
 Take the Keflex  500 mg 3 times a day with food for 7 days to treat potential bacterial sources of the redness in your bellybutton.  Using a clean Q-tip for each application apply a small amount of clotrimazole  to the red area in your bellybutton twice daily for the next week.  You may use over-the-counter Tylenol and/or ibuprofen  Corda the pack instructions as needed for any discomfort you may experience.  If the area becomes more red, hot, swollen, you develop pus drainage, or you start running fevers either return for reevaluation or follow-up with your primary care provider.

## 2024-03-13 ENCOUNTER — Emergency Department

## 2024-03-13 ENCOUNTER — Emergency Department
Admission: EM | Admit: 2024-03-13 | Discharge: 2024-03-14 | Disposition: A | Discharge status: Home / Self Care | Source: Home / Self Care | Attending: Emergency Medicine | Admitting: Emergency Medicine

## 2024-03-13 ENCOUNTER — Inpatient Hospital Stay: Admission: RE | Admit: 2024-03-13 | Discharge: 2024-03-13 | Payer: Self-pay

## 2024-03-13 VITALS — BP 111/82 | HR 84 | Temp 98.4°F | Resp 16 | Wt 253.7 lb

## 2024-03-13 DIAGNOSIS — K8020 Calculus of gallbladder without cholecystitis without obstruction: Secondary | ICD-10-CM | POA: Diagnosis not present

## 2024-03-13 DIAGNOSIS — R1011 Right upper quadrant pain: Secondary | ICD-10-CM

## 2024-03-13 DIAGNOSIS — R748 Abnormal levels of other serum enzymes: Secondary | ICD-10-CM | POA: Insufficient documentation

## 2024-03-13 DIAGNOSIS — R1013 Epigastric pain: Secondary | ICD-10-CM | POA: Diagnosis present

## 2024-03-13 DIAGNOSIS — R1010 Upper abdominal pain, unspecified: Secondary | ICD-10-CM

## 2024-03-13 LAB — URINALYSIS, ROUTINE W REFLEX MICROSCOPIC
Bilirubin Urine: NEGATIVE
Glucose, UA: NEGATIVE mg/dL
Hgb urine dipstick: NEGATIVE
Ketones, ur: NEGATIVE mg/dL
Leukocytes,Ua: NEGATIVE
Nitrite: NEGATIVE
Protein, ur: NEGATIVE mg/dL
Specific Gravity, Urine: 1.028 (ref 1.005–1.030)
pH: 5 (ref 5.0–8.0)

## 2024-03-13 LAB — COMPREHENSIVE METABOLIC PANEL WITH GFR
ALT: 981 U/L — ABNORMAL HIGH (ref 0–44)
AST: 585 U/L — ABNORMAL HIGH (ref 15–41)
Albumin: 4.1 g/dL (ref 3.5–5.0)
Alkaline Phosphatase: 194 U/L — ABNORMAL HIGH (ref 38–126)
Anion gap: 9 (ref 5–15)
BUN: 11 mg/dL (ref 6–20)
CO2: 22 mmol/L (ref 22–32)
Calcium: 9.1 mg/dL (ref 8.9–10.3)
Chloride: 106 mmol/L (ref 98–111)
Creatinine, Ser: 0.87 mg/dL (ref 0.44–1.00)
GFR, Estimated: >60 mL/min (ref >60)
Glucose, Bld: 94 mg/dL (ref 70–99)
Potassium: 3.9 mmol/L (ref 3.5–5.1)
Sodium: 137 mmol/L (ref 135–145)
Total Bilirubin: 1.3 mg/dL — ABNORMAL HIGH (ref 0.0–1.2)
Total Protein: 7.4 g/dL (ref 6.5–8.1)

## 2024-03-13 LAB — CBC
HCT: 37.3 % (ref 36.0–46.0)
Hemoglobin: 12.4 g/dL (ref 12.0–15.0)
MCH: 26.1 pg (ref 26.0–34.0)
MCHC: 33.2 g/dL (ref 30.0–36.0)
MCV: 78.5 fL — ABNORMAL LOW (ref 80.0–100.0)
Platelets: 317 K/uL (ref 150–400)
RBC: 4.75 MIL/uL (ref 3.87–5.11)
RDW: 13.6 % (ref 11.5–15.5)
WBC: 5.6 K/uL (ref 4.0–10.5)
nRBC: 0 % (ref 0.0–0.2)

## 2024-03-13 LAB — LIPASE, BLOOD: Lipase: 27 U/L (ref 11–51)

## 2024-03-13 LAB — POC URINE PREG, ED: Preg Test, Ur: NEGATIVE

## 2024-03-13 MED ORDER — OXYCODONE HCL 5 MG PO TABS: 5.0000 mg | ORAL_TABLET | Freq: Three times a day (TID) | ORAL | 0 refills | Status: DC | PRN

## 2024-03-13 MED ORDER — GADOBUTROL 1 MMOL/ML IV SOLN
10.0000 mL | Freq: Once | INTRAVENOUS | Status: AC | PRN
  Administered 2024-03-13: 21:00:00 10 mL via INTRAVENOUS

## 2024-03-13 MED ORDER — ONDANSETRON 4 MG PO TBDP: 4.0000 mg | ORAL_TABLET | Freq: Four times a day (QID) | ORAL | 0 refills | Status: DC | PRN

## 2024-03-13 NOTE — ED Provider Notes (Signed)
 [facility]    CSN: [id number] Arrival date & time: 03/13/24  1208      History   Chief Complaint Chief Complaint  Patient presents with   Abdominal Pain    HPI ARMIYAH CAPRON is a 28 y.o. female.   HPI  28 year old female with past medical history significant for PCOS and cholelithiasis affecting pregnancy presents for evaluation of severe epigastric and left upper quadrant abdominal pain with associated chills and nausea that began yesterday.  The pain was so intense that her mother called EMS who declined to transport the patient instructing her to come to urgent care in the morning.  Patient has had nausea but no vomiting or diarrhea.  No fever.  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   Pregnancy with uncertain fetal viability 06/23/2021   Threatened abortion in first trimester 06/23/2021   Preeclampsia, severe, third trimester 10/13/2020   History of pre-eclampsia in prior pregnancy, currently pregnant 10/13/2020   Insulin controlled gestational diabetes mellitus (GDM) in third trimester 08/12/2020   History of gestational diabetes in prior pregnancy, currently pregnant 08/12/2020   Excessive weight gain during pregnancy in second trimester 07/14/2020   History of back surgery 04/14/2020   Obesity affecting pregnancy in third trimester 03/31/2020   Depression during pregnancy in first trimester 03/31/2020   Immunization counseling 03/31/2020   Supervision of high risk pregnancy in third trimester 03/06/2020   PCOS (polycystic ovarian syndrome) 12/15/2016    Past Surgical History:  Procedure Laterality Date   ANKLE SURGERY      BACK SURGERY     CESAREAN SECTION  10/14/2020   preeclampsia    OB History     Gravida  3   Para  1   Term  0   Preterm  1   AB  1   Living  1      SAB  1   IAB  0   Ectopic  0   Multiple  0   Live Births  1            Home Medications    Prior to Admission medications  Medication Sig Start Date End Date Taking? Authorizing Provider  famotidine (PEPCID) 40 MG tablet Take 40 mg by mouth at bedtime. 11/28/23 11/27/24 Yes [provider]  metFORMIN (GLUCOPHAGE-XR) 500 MG 24 hr tablet Take 1 tablet (500 mg total) by mouth daily with dinner 01/23/24 01/22/25 Yes [provider]  nystatin (MYCOSTATIN/NYSTOP) powder Apply topically 2 (two) times daily 01/23/24 01/22/25 Yes [provider]  phentermine 15 MG capsule Take 1 capsule (15 mg total) by mouth every morning before breakfast 02/24/24  Yes [provider]  topiramate (TOPAMAX) 25 MG tablet Take 25 mg by mouth 2 (two) times daily. 01/23/24  Yes [provider]  cetirizine  (ZYRTEC  ALLERGY) 10 MG tablet Take 1 tablet (10 mg total) by mouth daily for 7 days. 06/28/21 07/05/21  Woods, Jaclyn M, PA-C  clotrimazole  (LOTRIMIN ) 1 % cream Apply to affected area 2 times daily 10/22/23   Bernardino Ditch, NP  DULoxetine (CYMBALTA) 20 MG capsule Take 20 mg by mouth daily.    [provider]  WEGOVY 2.4 MG/0.75ML SOAJ INJECT 2.4 MG SUBCUTANEOUSLY  ONCE A WEEK 07/20/23   [provider]    Family History History reviewed. No pertinent family history.  Social History Social History[1]   Allergies   Nifedipine   Review of Systems Review of Systems  Constitutional:  Positive for chills and diaphoresis. Negative for fever.  Gastrointestinal:  Positive for abdominal pain and nausea.     Physical Exam Triage Vital Signs ED Triage Vitals [03/13/24 1237]  Encounter Vitals Group     BP      Girls Systolic BP Percentile      Girls Diastolic BP Percentile       Boys Systolic BP Percentile      Boys Diastolic BP Percentile      Pulse      Resp      Temp      Temp src      SpO2      Weight 253 lb 11.2 oz (115.1 kg)     Height      Head Circumference      Peak Flow      Pain Score      Pain Loc      Pain Education      Exclude from Growth Chart    No data found.  Updated Vital Signs BP 111/82 (BP Location: Right Arm)   Pulse 84   Temp 98.4 F (36.9 C) (Oral)   Resp 16   Wt 253 lb 11.2 oz (115.1 kg)   SpO2 98%   BMI 40.95 kg/m   Visual Acuity Right Eye Distance:   Left Eye Distance:   Bilateral Distance:    Right Eye Near:   Left Eye Near:    Bilateral Near:     Physical Exam Vitals and nursing note reviewed.  Constitutional:      Appearance: Normal appearance. She is well-developed. She is not ill-appearing.  HENT:     Head: Normocephalic and atraumatic.  Cardiovascular:     Rate and Rhythm: Normal rate and regular rhythm.     Pulses: Normal pulses.     Heart sounds: Normal heart sounds. No murmur heard.    No friction rub. No gallop.  Pulmonary:     Effort: Pulmonary effort is normal.     Breath sounds: Normal breath sounds. No wheezing, rhonchi or rales.  Abdominal:     Palpations: Abdomen is soft.     Tenderness: There is abdominal tenderness. There is no guarding or rebound.  Skin:    General: Skin is warm and dry.     Capillary Refill: Capillary refill takes less than 2 seconds.     Findings: No rash.  Neurological:     General: No focal deficit present.     Mental Status: She is alert and oriented to person, place, and time.      UC Treatments / Results  Labs (all labs ordered are listed, but only abnormal results are displayed) Labs Reviewed - No data to display  EKG   Radiology No results found.  Procedures Procedures (including critical care time)  Medications Ordered in UC Medications - No data to display  Initial Impression / Assessment and Plan / UC Course  I have reviewed the  triage vital signs and the nursing notes.  Pertinent labs & imaging results that were available during my care of the patient were reviewed by me and considered in my medical decision making (see chart for details).   Patient  is a pleasant 28 year old female presenting for evaluation of right upper quadrant epigastric abdominal pain that began yesterday.  It initially was severe and lasted approximately 2 hours but has remained constant.  This is associated with nausea.  Yesterday patient was having alternating sweats and chills but no measured fever.  The patient reports that the pain does increase with food consumption.  In April of last year the patient had an ultrasound which showed a 1.9 cm nonmobile gallstone at the gallbladder neck with mild gallbladder wall thickening.  Also mild hepatomegaly.  The patient is currently tender in the epigastrium and right upper quadrant without guarding.  Given this previous imaging, and lack of surgical intervention, I am concerned the patient has developed acute cholecystitis.  I have advised her that she needs lab work and imaging that we can only provide partial up here.  If she does not fact have cholecystitis she will most likely need surgery which we can also not perform.  Therefore, I am recommending she go to the emergency department.  She has elected to go to [facility].   Final Clinical Impressions(s) / UC Diagnoses   Final diagnoses:  Right upper quadrant abdominal pain     Discharge Instructions      As we discussed, there is concern for acute cholecystitis.  Your ultrasound from April 2024 showed a 1.9 cm gallstone in the gallbladder neck.  You need repeat imaging, lab work, and possibly surgery given your current symptoms and this past imaging.  Please go to the emergency department at [facility].  Do not eat or drink anything until after you have been evaluated in the ER.  Please go to.     ED Prescriptions   None    PDMP  not reviewed this encounter.    [1]  Social History Tobacco Use   Smoking status: Never   Smokeless tobacco: Never  Vaping Use   Vaping status: Never Used  Substance Use Topics   Alcohol use: Not Currently    Alcohol/week: 1.0 standard drink of alcohol    Types: 1 Standard drinks or equivalent per week    Comment: last use 10/2021   Drug use: Not Currently    Frequency: 7.0 times per week    Types: Marijuana    Comment: smokes marijuana daily- last use 02/22/20     Bernardino Ditch, NP 03/13/24 1302

## 2024-03-13 NOTE — ED Triage Notes (Signed)
 Pt presents to the ED via [facility] from UC. Pt reports epigastric pain and nausea since yesterday morning. Pt reports hx of gallstone. No previous abdominal surgeries.

## 2024-03-13 NOTE — ED Provider Notes (Signed)
 11:20 PM  Assumed care at shift change.  Patient with abdominal pain.  Ultrasound shows gallstones.  Has elevated liver function test.  MRCP pending to rule out choledocholithiasis.  No cholecystitis seen on ultrasound.  No fever, leukocytosis.  11:46 PM  Pt's MRCP reviewed and interpreted by myself and the radiologist.  No cholecystitis or choledocholithiasis.  Discussed plan with patient.  She would prefer discharge home because she has children at home and follow-up with her PCP and surgery as an outpatient.  Recommended avoiding Tylenol, alcohol and eating a low-fat diet.  Will discharge with pain and nausea medicine.  She has a PCP to follow her liver function test.  Discussed at length return precautions.  She is feeling much better here.  She verbalized understanding and is ready for discharge.   Of note, MRCP was concerning for possible hemochromatosis although hemoglobin is 12.4.  Will add on iron studies which can be followed by her primary care provider.  At this time, I do not feel there is any life-threatening condition present. I reviewed all nursing notes, vitals, pertinent previous records.  All lab and urine results, EKGs, imaging ordered have been independently reviewed and interpreted by myself.  I reviewed all available radiology reports from any imaging ordered this visit.  Based on my assessment, I feel the patient is safe to be discharged home without further emergent workup and can continue workup as an outpatient as needed. Discussed all findings, treatment plan as well as usual and customary return precautions.  They verbalize understanding and are comfortable with this plan.  Outpatient follow-up has been provided as needed.  All questions have been answered.      Artist Bloom, Josette SAILOR, DO 03/13/24 2352

## 2024-03-13 NOTE — ED Triage Notes (Signed)
 Pt c/o upper abd pain,chills & nausea x1 day. States pain comes & goes. Pain got severe that she called EMS for eval but wasn't transported. Concerned for gallstone obstruction. Had US  at [facility] when she was preg which showed a stone.

## 2024-03-13 NOTE — ED Provider Notes (Signed)
 [facility] Provider Note    Event Date/Time   First MD Initiated Contact with Patient 03/13/24 1540     (approximate)   History   Abdominal Pain   HPI  Christine Oconnor is a 28 y.o. female  with history of PCOS, cholelithiasis, anemia, and as listed in EMR presents to the emergency department for treatment and evaluation of epigastric pain that started yesterday. She has also had occasional nausea. No fever or diarrhea.    Physical Exam    Vitals:   03/13/24 1819 03/13/24 1822  BP: (!) 136/93 (!) 136/93  Pulse: 69 69  Resp:  18  Temp:  98.3 F (36.8 C)  SpO2: 100% 100%    General: Awake, no distress.  CV:  Good peripheral perfusion.  Resp:  Normal effort.  Abd:  No distention. Soft. Tender in epigastric area and upper abdomen. Other:     ED Results / Procedures / Treatments   Labs (all labs ordered are listed, but only abnormal results are displayed)  Labs Reviewed  COMPREHENSIVE METABOLIC PANEL WITH GFR - Abnormal; Notable for the following components:      Result Value   AST 585 (*)    ALT 981 (*)    Alkaline Phosphatase 194 (*)    Total Bilirubin 1.3 (*)    All other components within normal limits  CBC - Abnormal; Notable for the following components:   MCV 78.5 (*)    All other components within normal limits  URINALYSIS, ROUTINE W REFLEX MICROSCOPIC - Abnormal; Notable for the following components:   Color, Urine AMBER (*)    APPearance CLEAR (*)    All other components within normal limits  LIPASE, BLOOD  POC URINE PREG, ED     EKG  Not indicated.   RADIOLOGY  Image and radiology report reviewed and interpreted by me. Radiology report consistent with the same.  Right upper quadrant ultrasound shows moderate cholelithiasis with the largest stone measuring 2.3 cm.  Gallbladder wall at the upper limits of normal in diameter measuring 3 mm and no adjacent free fluid.  Negative sonographic Murphy sign.  Mild hepatic  steatosis.  PROCEDURES:  Critical Care performed: No  Procedures   MEDICATIONS ORDERED IN ED:  Medications  gadobutrol  (GADAVIST ) 1 MMOL/ML injection 10 mL (10 mLs Intravenous Contrast Given 03/13/24 2100)     IMPRESSION / MDM / ASSESSMENT AND PLAN / ED COURSE   I have reviewed the triage note and vital signs. Vital signs: BP elevated--will have rechecked   Differential diagnosis includes, but is not limited to, cholelithiasis, acute cholecystitis, choledocholithiasis, pancreatitis  Patient's presentation is most consistent with acute complicated illness / injury requiring diagnostic workup.  28 year old female presenting to the emergency department for treatment and evaluation of epigastric and upper abdominal pain that started yesterday with a history of cholelithiasis.  See HPI for further details.  While awaiting ER room assignment, labs were obtained.  CBC shows no leukocytosis.  CMP however shows an elevated AST at 585, ALT of 981, alkaline phosphatase of 194 with a T. bili of 1.3.  Lipase is normal at 27.  On exam, patient is resting comfortably in the bed and reports that her pain is well-controlled at this time.  Awaiting ultrasound.  Ultrasound shows moderate cholelithiasis without evidence of acute cholecystitis and mild hepatic steatosis.  Common bile duct measures 5 mm.   Case discussed with ED attending.  Although her ultrasound is overall reassuring without evidence of  ductal dilatation, due to elevated liver enzymes plan will be to get an MRCP.  Clinical Course as of 03/13/24 2320  Wed Mar 13, 2024  2019 MRI estimates they will be here to take her in about 30 minutes. [CT]  2239 Radiology reading room contacted. Preliminary reading requested.   [CT]  2315 Care transferred to oncoming ED attending, Dr. Neomi who will follow-up on preliminary MRCP reading. [CT]    Clinical Course User Index [CT] Chitara Clonch B, FNP     FINAL CLINICAL IMPRESSION(S) / ED  DIAGNOSES   Final diagnoses:  Elevated liver enzymes  Pain of upper abdomen     Rx / DC Orders   ED Discharge Orders     None        Note:  This document was prepared using Dragon voice recognition software and may include unintentional dictation errors.   Herlinda Kirk NOVAK, FNP 03/13/24 2321    Floy Roberts, MD 03/13/24 [phone]

## 2024-03-13 NOTE — Discharge Instructions (Addendum)
 Your imaging today showed gallstones but no sign of obstructed ducts within your liver or biliary tree and no sign of infection.  I recommend to follow-up closely with your primary care doctor to have your liver function test followed to make sure they are improving.  I recommend avoiding Tylenol and alcohol.  I recommend eating a low-fat diet.  Please call surgery for outpatient follow-up to discuss gallbladder removal as an outpatient.  If you develop worsening pain is uncontrolled at home, vomiting that does not stop, fever of 100.4 or higher, yellowing of your skin or eyes, extremely light-colored stool, return to the emergency department.  Your MRI also showed possible elevated levels of iron in your liver called hemochromatosis.  We have added on iron function studies and you will need to follow-up with your primary care doctor for this.   You are being provided a prescription for opiates (also known as narcotics) for pain control.  Opiates can be addictive and should only be used when absolutely necessary for pain control when other alternatives do not work.  We recommend you only use them for the recommended amount of time and only as prescribed.  Please do not take with other sedative medications or alcohol.  Please do not drive, operate machinery, make important decisions while taking opiates.  Please note that these medications can be addictive and have high abuse potential.  Patients can become addicted to narcotics after only taking them for a few days.  Please keep these medications locked away from children, teenagers or any family members with history of substance abuse.  Narcotic pain medicine may also make you constipated.  You may use over-the-counter medications such as MiraLAX, Colace to prevent constipation.  If you become constipated, you may use over-the-counter enemas as needed.  Itching and nausea are also common side effects of narcotic pain medication.  If you develop  uncontrolled vomiting or a rash, please stop these medications and seek medical care.

## 2024-03-13 NOTE — ED Notes (Signed)
 Patient is being discharged from the Urgent Care and sent to the Emergency Department via [facility] . Per Venetia Motto NP, patient is in need of higher level of care due to RUQ pain. Patient is aware and verbalizes understanding of plan of care.  Vitals:   03/13/24 1243  BP: 111/82  Pulse: 84  Resp: 16  Temp: 98.4 F (36.9 C)  SpO2: 98%

## 2024-03-13 NOTE — Discharge Instructions (Addendum)
 As we discussed, there is concern for acute cholecystitis.  Your ultrasound from April 2024 showed a 1.9 cm gallstone in the gallbladder neck.  You need repeat imaging, lab work, and possibly surgery given your current symptoms and this past imaging.  Please go to the emergency department at [facility].  Do not eat or drink anything until after you have been evaluated in the ER.  Please go to now.

## 2024-03-14 LAB — FERRITIN: Ferritin: 65 ng/mL (ref 11–307)

## 2024-03-14 LAB — IRON AND TIBC
Iron: 83 ug/dL (ref 28–170)
Saturation Ratios: 25 % (ref 10.4–31.8)
TIBC: 333 ug/dL (ref 250–450)
UIBC: 250 ug/dL

## 2024-03-14 LAB — TRANSFERRIN: Transferrin: 282 mg/dL (ref 192–382)

## 2024-03-15 ENCOUNTER — Encounter: Payer: Self-pay | Admitting: Surgery

## 2024-03-15 ENCOUNTER — Telehealth: Payer: Self-pay | Admitting: Surgery

## 2024-03-15 ENCOUNTER — Ambulatory Visit: Admitting: Surgery

## 2024-03-15 VITALS — BP 109/73 | HR 87 | Ht 66.0 in | Wt 255.0 lb

## 2024-03-15 DIAGNOSIS — K8020 Calculus of gallbladder without cholecystitis without obstruction: Secondary | ICD-10-CM

## 2024-03-15 NOTE — Patient Instructions (Signed)
 You have requested to have your gallbladder removed. This will be done at [facility] with Dr. Desiderio.   If you are on any injectable weight loss medication, you will need to stop taking your GLP-1 injectable (weight loss) medications 8 days before your surgery to avoid any complications with anesthesia.    You will most likely be out of work 1-2 weeks for this surgery.  If you have FMLA or disability paperwork that needs filled out you may drop this off at our office or this can be faxed to [phone].   You will return after your post-op appointment with a lifting restriction for approximately 4 more weeks.   You will be able to eat anything you would like to following surgery. But, start by eating a bland diet and advance this as tolerated. The Gallbladder diet is below, please go as closely by this diet as possible prior to surgery to avoid any further attacks.   Please see the (blue)pre-care form that you have been given today. You will receive several phone calls from [facility] prior to your surgery date.   If you have any questions, please call our office.   Laparoscopic Cholecystectomy Laparoscopic cholecystectomy is surgery to remove the gallbladder. The gallbladder is located in the upper right part of the abdomen, behind the liver. It is a storage sac for bile, which is produced in the liver. Bile aids in the digestion and absorption of fats. Cholecystectomy is often done for inflammation of the gallbladder (cholecystitis). This condition is usually caused by a buildup of gallstones (cholelithiasis) in the gallbladder. Gallstones can block the flow of bile, and that can result in inflammation and pain. In severe cases, emergency surgery may be required. If emergency surgery is not required, you will have time to prepare for the procedure. Laparoscopic surgery is an alternative to open surgery. Laparoscopic surgery has a shorter recovery time. Your common bile duct may also  need to be examined during the procedure. If stones are found in the common bile duct, they may be removed. LET [facility] CARE PROVIDER KNOW ABOUT: Any allergies you have. All medicines you are taking, including vitamins, herbs, eye drops, creams, and over-the-counter medicines. Previous problems you or members of your family have had with the use of anesthetics. Any blood disorders you have. Previous surgeries you have had.  Any medical conditions you have. RISKS AND COMPLICATIONS Generally, this is a safe procedure. However, problems may occur, including: Infection. Bleeding. Allergic reactions to medicines. Damage to other structures or organs. A stone remaining in the common bile duct. A bile leak from the cyst duct that is clipped when your gallbladder is removed. The need to convert to open surgery, which requires a larger incision in the abdomen. This may be necessary if your surgeon thinks that it is not safe to continue with a laparoscopic procedure. BEFORE THE PROCEDURE Ask your health care provider about: Changing or stopping your regular medicines. This is especially important if you are taking diabetes medicines or blood thinners. Taking medicines such as aspirin and ibuprofen . These medicines can thin your blood. Do not take these medicines before your procedure if your health care provider instructs you not to. Follow instructions from your health care provider about eating or drinking restrictions. Let your health care provider know if you develop a cold or an infection before surgery. Plan to have someone take you home after the procedure. Ask your health care provider how your surgical site will be  marked or identified. You may be given antibiotic medicine to help prevent infection. PROCEDURE To reduce your risk of infection: Your health care team will wash or sanitize their hands. Your skin will be washed with soap. An IV tube may be inserted into one of your  veins. You will be given a medicine to make you fall asleep (general anesthetic). A breathing tube will be placed in your mouth. The surgeon will make several small cuts (incisions) in your abdomen. A thin, lighted tube (laparoscope) that has a tiny camera on the end will be inserted through one of the small incisions. The camera on the laparoscope will send a picture to a TV screen (monitor) in the operating room. This will give the surgeon a good view inside your abdomen. A gas will be pumped into your abdomen. This will expand your abdomen to give the surgeon more room to perform the surgery. Other tools that are needed for the procedure will be inserted through the other incisions. The gallbladder will be removed through one of the incisions. After your gallbladder has been removed, the incisions will be closed with stitches (sutures), staples, or skin glue. Your incisions may be covered with a bandage (dressing). The procedure may vary among health care providers and hospitals. AFTER THE PROCEDURE Your blood pressure, heart rate, breathing rate, and blood oxygen level will be monitored often until the medicines you were given have worn off. You will be given medicines as needed to control your pain.   This information is not intended to replace advice given to you by your health care provider. Make sure you discuss any questions you have with your health care provider.   Document Released: 03/14/2005 Document Revised: 12/03/2014 Document Reviewed: 10/24/2012 Elsevier Interactive Patient Education 2016 Elsevier Inc.     Low-Fat Diet for Gallbladder Conditions A low-fat diet can be helpful if you have pancreatitis or a gallbladder condition. With these conditions, your pancreas and gallbladder have trouble digesting fats. A healthy eating plan with less fat will help rest your pancreas and gallbladder and reduce your symptoms. WHAT DO I NEED TO KNOW ABOUT THIS DIET? Eat a low-fat  diet. Reduce your fat intake to less than 20-30% of your total daily calories. This is less than 50-60 g of fat per day. Remember that you need some fat in your diet. Ask your dietician what your daily goal should be. Choose nonfat and low-fat healthy foods. Look for the words nonfat, low fat, or fat free. As a guide, look on the label and choose foods with less than 3 g of fat per serving. Eat only one serving. Avoid alcohol. Do not smoke. If you need help quitting, talk with your health care provider. Eat small frequent meals instead of three large heavy meals. WHAT FOODS CAN I EAT? Grains Include healthy grains and starches such as potatoes, wheat bread, fiber-rich cereal, and brown rice. Choose whole grain options whenever possible. In adults, whole grains should account for 45-65% of your daily calories.  Fruits and Vegetables Eat plenty of fruits and vegetables. Fresh fruits and vegetables add fiber to your diet. Meats and Other Protein Sources Eat lean meat such as chicken and pork. Trim any fat off of meat before cooking it. Eggs, fish, and beans are other sources of protein. In adults, these foods should account for 10-35% of your daily calories. Dairy Choose low-fat milk and dairy options. Dairy includes fat and protein, as well as calcium.  Fats and Oils Limit high-fat  foods such as fried foods, sweets, baked goods, sugary drinks.  Other Creamy sauces and condiments, such as mayonnaise, can add extra fat. Think about whether or not you need to use them, or use smaller amounts or low fat options. WHAT FOODS ARE NOT RECOMMENDED? High fat foods, such as: [employer]. Ice cream. French toast. Sweet rolls. Pizza. Cheese bread. Foods covered with batter, butter, creamy sauces, or cheese. Fried foods. Sugary drinks and desserts. Foods that cause gas or bloating   This information is not intended to replace advice given to you by your health care provider. Make sure you  discuss any questions you have with your health care provider.   Document Released: 03/19/2013 Document Reviewed: 03/19/2013 Elsevier Interactive Patient Education [employer]

## 2024-03-15 NOTE — H&P (View-Only) (Signed)
 " 03/15/2024  Reason for Visit: Symptomatic cholelithiasis  History of Present Illness: Christine Oconnor is a 28 y.o. female presenting for evaluation of symptomatic cholelithiasis.  The patient presented to the emergency room on 03/13/2024 with epigastric pain associated with nausea.  The pain has started 2 days prior to her presentation had improved the prior day but due to transportation issues she was not able to go until the 17th.  In the emergency room, her workup revealed elevated LFTs.  She had ultrasound of the right upper quadrant which showed cholelithiasis with stones measuring up to 2.3 cm with borderline gallbladder wall thickening.  Given the elevated LFTs, she had an MRCP which showed cholelithiasis but no choledocholithiasis or cholecystitis.  Her pain had resolved and she was being able to discharge home.  Patient reports that she has been doing well since her discharge and denies any new or worsening pain.  She reports that she was diagnosed with gallstones when she was pregnant but had not had any issues since her pregnancy.  Denies any fevers, chills, chest pain, shortness of breath.  However on the day that her pain was ongoing, she did have sweats and dizziness/lightheadedness, and had also noted that her urine color was darker.  Past Medical History: Past Medical History:  Diagnosis Date   Anemia    PCOS (polycystic ovarian syndrome)      Past Surgical History: Past Surgical History:  Procedure Laterality Date   ANKLE SURGERY Bilateral    BACK SURGERY     X 3 Lumbar   CESAREAN SECTION  10/14/2020   preeclampsia    Home Medications: Prior to Admission medications  Medication Sig Start Date End Date Taking? Authorizing Provider  cetirizine  (ZYRTEC  ALLERGY) 10 MG tablet Take 1 tablet (10 mg total) by mouth daily for 7 days. 06/28/21 03/15/24 Yes Woods, Jaclyn M, PA-C  clotrimazole  (LOTRIMIN ) 1 % cream Apply to affected area 2 times daily 10/22/23  Yes Bernardino Ditch, NP   famotidine (PEPCID) 40 MG tablet Take 40 mg by mouth at bedtime. 11/28/23 11/27/24 Yes [provider]  metFORMIN (GLUCOPHAGE-XR) 500 MG 24 hr tablet Take 1 tablet (500 mg total) by mouth daily with dinner 01/23/24 01/22/25 Yes [provider]  nystatin (MYCOSTATIN/NYSTOP) powder Apply topically 2 (two) times daily 01/23/24 01/22/25 Yes [provider]  ondansetron  (ZOFRAN -ODT) 4 MG disintegrating tablet Take 1 tablet (4 mg total) by mouth every 6 (six) hours as needed for nausea or vomiting. 03/13/24  Yes Ward, Josette SAILOR, DO  oxyCODONE  (ROXICODONE ) 5 MG immediate release tablet Take 1 tablet (5 mg total) by mouth every 8 (eight) hours as needed. 03/13/24 03/13/25 Yes Ward, Josette SAILOR, DO  phentermine 15 MG capsule Take 1 capsule (15 mg total) by mouth every morning before breakfast 02/24/24  Yes [provider]  topiramate (TOPAMAX) 25 MG tablet Take 25 mg by mouth 2 (two) times daily. 01/23/24  Yes [provider]    Allergies: Allergies[1]  Social History:  reports that she has never smoked. She has never been exposed to tobacco smoke. She has never used smokeless tobacco. She reports that she does not currently use alcohol after a past usage of about 1.0 standard drink of alcohol per week. She reports that she does not currently use drugs after having used the following drugs: Marijuana. Frequency: 7.00 times per week.   Family History: History reviewed. No pertinent family history.  Review of Systems: Review of Systems  Constitutional:  Positive for diaphoresis.  Negative for chills and fever.  HENT:  Negative for hearing loss.   Respiratory:  Positive for shortness of breath (due to pain).   Cardiovascular:  Negative for chest pain.  Gastrointestinal:  Positive for abdominal pain and nausea.  Genitourinary:  Negative for dysuria.  Musculoskeletal:  Negative for myalgias.  Neurological:  Positive for dizziness.  Psychiatric/Behavioral:   Negative for depression.     Physical Exam BP 109/73   Pulse 87   Ht 5' 6 (1.676 m)   Wt 255 lb (115.7 kg)   LMP 02/20/2024 (Approximate)   SpO2 98%   BMI 41.16 kg/m  CONSTITUTIONAL: No acute distress HEENT:  Normocephalic, atraumatic, extraocular motion intact. NECK: Trachea is midline, and there is no jugular venous distension.  RESPIRATORY:  Lungs are clear, and breath sounds are equal bilaterally. Normal respiratory effort without pathologic use of accessory muscles. CARDIOVASCULAR: Heart is regular without murmurs, gallops, or rubs. GI: The abdomen is soft, obese, nondistended, currently with mild soreness to palpation in the epigastric region.  However negative Murphy sign. MUSCULOSKELETAL:  Normal muscle strength and tone in all four extremities.  No peripheral edema or cyanosis. SKIN: No jaundice. NEUROLOGIC:  Motor and sensation is grossly normal.  Cranial nerves are grossly intact. PSYCH:  Alert and oriented to person, place and time. Affect is normal.  Laboratory Analysis: Results for orders placed or performed during the hospital encounter of 03/13/24 (from the past 48 hours)  Lipase, blood     Status: None   Collection Time: 03/13/24  1:57 PM  Result Value Ref Range   Lipase 27 11 - 51 U/L    Comment: Performed at [facility], [address] [place], [place] [address]  Comprehensive metabolic panel     Status: Abnormal   Collection Time: 03/13/24  1:57 PM  Result Value Ref Range   Sodium 137 135 - 145 mmol/L   Potassium 3.9 3.5 - 5.1 mmol/L   Chloride 106 98 - 111 mmol/L   CO2 22 22 - 32 mmol/L   Glucose, Bld 94 70 - 99 mg/dL    Comment: Glucose reference range applies only to samples taken after fasting for at least 8 hours.   BUN 11 6 - 20 mg/dL   Creatinine, Ser 9.12 0.44 - 1.00 mg/dL   Calcium 9.1 8.9 - 89.6 mg/dL   Total Protein 7.4 6.5 - 8.1 g/dL   Albumin 4.1 3.5 - 5.0 g/dL   AST 414 (H) 15 - 41 U/L   ALT 981 (H) 0 - 44 U/L   Alkaline  Phosphatase 194 (H) 38 - 126 U/L   Total Bilirubin 1.3 (H) 0.0 - 1.2 mg/dL   GFR, Estimated >39 >39 mL/min    Comment: (NOTE) Calculated using the CKD-EPI Creatinine Equation (2021)    Anion gap 9 5 - 15    Comment: Performed at [facility], [address] [place], [place] [address]  CBC     Status: Abnormal   Collection Time: 03/13/24  1:57 PM  Result Value Ref Range   WBC 5.6 4.0 - 10.5 K/uL   RBC 4.75 3.87 - 5.11 MIL/uL   Hemoglobin 12.4 12.0 - 15.0 g/dL   HCT 62.6 63.9 - 53.9 %   MCV 78.5 (L) 80.0 - 100.0 fL   MCH 26.1 26.0 - 34.0 pg   MCHC 33.2 30.0 - 36.0 g/dL   RDW 86.3 88.4 - 84.4 %   Platelets 317 150 - 400 K/uL   nRBC 0.0  0.0 - 0.2 %    Comment: Performed at [facility], [address] [place], [place] [address]  Urinalysis, Routine w reflex microscopic -Urine, Clean Catch     Status: Abnormal   Collection Time: 03/13/24  1:57 PM  Result Value Ref Range   Color, Urine AMBER (A) YELLOW    Comment: BIOCHEMICALS MAY BE AFFECTED BY COLOR   APPearance CLEAR (A) CLEAR   Specific Gravity, Urine 1.028 1.005 - 1.030   pH 5.0 5.0 - 8.0   Glucose, UA NEGATIVE NEGATIVE mg/dL   Hgb urine dipstick NEGATIVE NEGATIVE   Bilirubin Urine NEGATIVE NEGATIVE   Ketones, ur NEGATIVE NEGATIVE mg/dL   Protein, ur NEGATIVE NEGATIVE mg/dL   Nitrite NEGATIVE NEGATIVE   Leukocytes,Ua NEGATIVE NEGATIVE    Comment: Performed at [facility], [address] [place], [place] [address]  Iron and TIBC     Status: None   Collection Time: 03/13/24  1:57 PM  Result Value Ref Range   Iron 83 28 - 170 ug/dL   TIBC 666 749 - 549 ug/dL   Saturation Ratios 25 10.4 - 31.8 %   UIBC 250 ug/dL    Comment: Performed at [facility], [address] [place], [place] [address]  Transferrin     Status: None   Collection Time: 03/13/24  1:57 PM  Result Value Ref Range   Transferrin 282 192 - 382 mg/dL    Comment: Performed at [facility], [address] [place], [place] [address]  Ferritin (Iron Binding Protein)     Status: None   Collection Time: 03/13/24  1:57 PM  Result Value Ref Range   Ferritin 65 11 - 307 ng/mL    Comment: Performed at [facility], [address] [place], [place] [address]  POC urine preg, ED     Status: None   Collection Time: 03/13/24  5:06 PM  Result Value Ref Range   Preg Test, Ur NEGATIVE NEGATIVE    Comment:        THE SENSITIVITY OF THIS METHODOLOGY IS >20 mIU/mL.     Imaging: MR ABDOMEN MRCP W WO CONTAST Result Date: 03/13/2024 EXAM: MRCP WITH AND WITHOUT IV CONTRAST 03/13/2024 09:12:30 PM TECHNIQUE: Multisequence, multiplanar magnetic resonance images of the abdomen with and without intravenous contrast. MRCP sequences were performed. 10 mL (gadobutrol  (GADAVIST ) 1 MMOL/ML injection 10 mL GADOBUTROL  1 MMOL/ML IV SOLN) was administered intravenously. COMPARISON: None available. CLINICAL HISTORY: Epigastric pain. Cholelithiasis. FINDINGS: LIVER: Relative decreased signal intensity of the hepatic parenchyma on T2-weighted imaging and paradoxical increase in signal intensity within these structures with in and opposed phase imaging is in keeping with changes of primary hemochromatosis. Correlation with serum iron studies is recommended for confirmation. No focal intrahepatic mass or intrahepatic biliary ductal dilation. Portal vein structures are patent. GALLBLADDER AND BILIARY SYSTEM: Cholelithiasis noted without superimposed pericholecystic inflammatory change to suggest acute cholecystitis. No extrahepatic biliary ductal dilation. Ampullary structures are patent. SPLEEN: Unremarkable. PANCREAS/PANCREATIC DUCT: Relative decreased signal intensity of the pancreas parenchyma on T2-weighted imaging and paradoxical increase in signal intensity within these structures with in and opposed phase imaging is in keeping with changes of primary hemochromatosis. Correlation with serum iron studies is recommended  for confirmation. No pancreatic ductal dilatation. ADRENAL GLANDS: Unremarkable. KIDNEYS: Unremarkable. LYMPH NODES: No enlarged abdominal lymph nodes. VASCULATURE: Unremarkable. PERITONEUM: No ascites. ABDOMINAL WALL: No hernia. No mass. BOWEL: Grossly unremarkable. No bowel obstruction. BONES: No acute abnormality or worrisome osseous lesion. SOFT  TISSUES: Unremarkable. MISCELLANEOUS: Unremarkable. IMPRESSION: 1. Cholelithiasis without superimposed pericholecystic inflammatory change to suggest acute cholecystitis. 2. Findings consistent with primary hemochromatosis; recommend serum iron studies for confirmation. Electronically signed by: Dorethia Molt MD 03/13/2024 11:30 PM EST RP Workstation: [id number]   MR 3D Recon At Scanner Result Date: 03/13/2024 EXAM: MRCP WITH AND WITHOUT IV CONTRAST 03/13/2024 09:12:30 PM TECHNIQUE: Multisequence, multiplanar magnetic resonance images of the abdomen with and without intravenous contrast. MRCP sequences were performed. 10 mL (gadobutrol  (GADAVIST ) 1 MMOL/ML injection 10 mL GADOBUTROL  1 MMOL/ML IV SOLN) was administered intravenously. COMPARISON: None available. CLINICAL HISTORY: Epigastric pain. Cholelithiasis. FINDINGS: LIVER: Relative decreased signal intensity of the hepatic parenchyma on T2-weighted imaging and paradoxical increase in signal intensity within these structures with in and opposed phase imaging is in keeping with changes of primary hemochromatosis. Correlation with serum iron studies is recommended for confirmation. No focal intrahepatic mass or intrahepatic biliary ductal dilation. Portal vein structures are patent. GALLBLADDER AND BILIARY SYSTEM: Cholelithiasis noted without superimposed pericholecystic inflammatory change to suggest acute cholecystitis. No extrahepatic biliary ductal dilation. Ampullary structures are patent. SPLEEN: Unremarkable. PANCREAS/PANCREATIC DUCT: Relative decreased signal intensity of the pancreas parenchyma on  T2-weighted imaging and paradoxical increase in signal intensity within these structures with in and opposed phase imaging is in keeping with changes of primary hemochromatosis. Correlation with serum iron studies is recommended for confirmation. No pancreatic ductal dilatation. ADRENAL GLANDS: Unremarkable. KIDNEYS: Unremarkable. LYMPH NODES: No enlarged abdominal lymph nodes. VASCULATURE: Unremarkable. PERITONEUM: No ascites. ABDOMINAL WALL: No hernia. No mass. BOWEL: Grossly unremarkable. No bowel obstruction. BONES: No acute abnormality or worrisome osseous lesion. SOFT TISSUES: Unremarkable. MISCELLANEOUS: Unremarkable. IMPRESSION: 1. Cholelithiasis without superimposed pericholecystic inflammatory change to suggest acute cholecystitis. 2. Findings consistent with primary hemochromatosis; recommend serum iron studies for confirmation. Electronically signed by: Dorethia Molt MD 03/13/2024 11:30 PM EST RP Workstation: [id number]   US  ABDOMEN LIMITED RUQ (LIVER/GB) Result Date: 03/13/2024 CLINICAL DATA:  Screens Ammon itis with right upper quadrant pain. EXAM: ULTRASOUND ABDOMEN LIMITED RIGHT UPPER QUADRANT COMPARISON:  None Available. FINDINGS: Gallbladder: Moderate cholelithiasis with largest stone measuring 2.3 cm. Gallbladder wall at the upper limits of normal in diameter measuring 3 mm. No adjacent free fluid. Negative sonographic Murphy sign. Common bile duct: Diameter: 5 mm. Liver: Mild increased parenchymal echogenicity compatible with a degree of steatosis. No focal mass. Portal vein is patent on color Doppler imaging with normal direction of blood flow towards the liver. Other: None. IMPRESSION: 1. Moderate cholelithiasis without additional sonographic evidence to suggest acute cholecystitis. 2. Mild hepatic steatosis. Electronically Signed   By: Toribio Agreste M.D.   On: 03/13/2024 16:23    Assessment and Plan: This is a 28 y.o. female with symptomatic cholelithiasis.  - Discussed with patient  the findings on her imaging studies and laboratory studies while in the emergency room.  Her LFTs were elevated but her symptoms had resolved.  I suspect potentially that the patient may have had a stone within the common bile duct that she eventually passed which is how the pain got improved and there was no evidence of choledocholithiasis on MRCP.  The patient currently is doing well but she is interested in surgery to prevent further episodes of this. - Discussed with patient that the plan for robotic assisted cholecystectomy and reviewed with her the surgery at length including the planned incisions, risks of bleeding, infection, injury to surrounding structures, this would be an outpatient procedure, the use of ICG to better evaluate the biliary anatomy, postoperative activity  restrictions, pain control, and she is willing to proceed. - We will schedule the patient for surgery on 04/09/2024.  All of her questions have been answered.  Will repeat labs to make sure her LFTs are back to normal.  She is aware that if they are still elevated we may have to do an intraoperative cholangiogram.  I spent 60 minutes dedicated to the care of this patient on the date of this encounter to include pre-visit review of records, face-to-face time with the patient discussing diagnosis and management, and any post-visit coordination of care.   Aloysius Sheree Plant, MD [facility]       [1]  Allergies Allergen Reactions   Nifedipine Other (See Comments) and Rash    Ha's.   "

## 2024-03-15 NOTE — Telephone Encounter (Signed)
 Patient has been advised of Pre-Admission date/time, and Surgery date at [facility].  Surgery Date: 04/09/24 Preadmission Testing Date: Preadmissions to call patient.    Patient informed of the scheduling process and surgery information given at time of office visit.  Patient has been made aware to call [phone], between 1-3:00pm the day before surgery, to find out what time to arrive for surgery.

## 2024-03-15 NOTE — Progress Notes (Signed)
 " 03/15/2024  Reason for Visit: Symptomatic cholelithiasis  History of Present Illness: Christine Oconnor is a 28 y.o. female presenting for evaluation of symptomatic cholelithiasis.  The patient presented to the emergency room on 03/13/2024 with epigastric pain associated with nausea.  The pain has started 2 days prior to her presentation had improved the prior day but due to transportation issues she was not able to go until the 17th.  In the emergency room, her workup revealed elevated LFTs.  She had ultrasound of the right upper quadrant which showed cholelithiasis with stones measuring up to 2.3 cm with borderline gallbladder wall thickening.  Given the elevated LFTs, she had an MRCP which showed cholelithiasis but no choledocholithiasis or cholecystitis.  Her pain had resolved and she was being able to discharge home.  Patient reports that she has been doing well since her discharge and denies any new or worsening pain.  She reports that she was diagnosed with gallstones when she was pregnant but had not had any issues since her pregnancy.  Denies any fevers, chills, chest pain, shortness of breath.  However on the day that her pain was ongoing, she did have sweats and dizziness/lightheadedness, and had also noted that her urine color was darker.  Past Medical History: Past Medical History:  Diagnosis Date   Anemia    PCOS (polycystic ovarian syndrome)      Past Surgical History: Past Surgical History:  Procedure Laterality Date   ANKLE SURGERY Bilateral    BACK SURGERY     X 3 Lumbar   CESAREAN SECTION  10/14/2020   preeclampsia    Home Medications: Prior to Admission medications  Medication Sig Start Date End Date Taking? Authorizing Provider  cetirizine  (ZYRTEC  ALLERGY) 10 MG tablet Take 1 tablet (10 mg total) by mouth daily for 7 days. 06/28/21 03/15/24 Yes Woods, Jaclyn M, PA-C  clotrimazole  (LOTRIMIN ) 1 % cream Apply to affected area 2 times daily 10/22/23  Yes Bernardino Ditch, NP   famotidine (PEPCID) 40 MG tablet Take 40 mg by mouth at bedtime. 11/28/23 11/27/24 Yes [provider]  metFORMIN (GLUCOPHAGE-XR) 500 MG 24 hr tablet Take 1 tablet (500 mg total) by mouth daily with dinner 01/23/24 01/22/25 Yes [provider]  nystatin (MYCOSTATIN/NYSTOP) powder Apply topically 2 (two) times daily 01/23/24 01/22/25 Yes [provider]  ondansetron  (ZOFRAN -ODT) 4 MG disintegrating tablet Take 1 tablet (4 mg total) by mouth every 6 (six) hours as needed for nausea or vomiting. 03/13/24  Yes Ward, Josette SAILOR, DO  oxyCODONE  (ROXICODONE ) 5 MG immediate release tablet Take 1 tablet (5 mg total) by mouth every 8 (eight) hours as needed. 03/13/24 03/13/25 Yes Ward, Josette SAILOR, DO  phentermine 15 MG capsule Take 1 capsule (15 mg total) by mouth every morning before breakfast 02/24/24  Yes [provider]  topiramate (TOPAMAX) 25 MG tablet Take 25 mg by mouth 2 (two) times daily. 01/23/24  Yes [provider]    Allergies: Allergies[1]  Social History:  reports that she has never smoked. She has never been exposed to tobacco smoke. She has never used smokeless tobacco. She reports that she does not currently use alcohol after a past usage of about 1.0 standard drink of alcohol per week. She reports that she does not currently use drugs after having used the following drugs: Marijuana. Frequency: 7.00 times per week.   Family History: History reviewed. No pertinent family history.  Review of Systems: Review of Systems  Constitutional:  Positive for diaphoresis.  Negative for chills and fever.  HENT:  Negative for hearing loss.   Respiratory:  Positive for shortness of breath (due to pain).   Cardiovascular:  Negative for chest pain.  Gastrointestinal:  Positive for abdominal pain and nausea.  Genitourinary:  Negative for dysuria.  Musculoskeletal:  Negative for myalgias.  Neurological:  Positive for dizziness.  Psychiatric/Behavioral:   Negative for depression.     Physical Exam BP 109/73   Pulse 87   Ht 5' 6 (1.676 m)   Wt 255 lb (115.7 kg)   LMP 02/20/2024 (Approximate)   SpO2 98%   BMI 41.16 kg/m  CONSTITUTIONAL: No acute distress HEENT:  Normocephalic, atraumatic, extraocular motion intact. NECK: Trachea is midline, and there is no jugular venous distension.  RESPIRATORY:  Lungs are clear, and breath sounds are equal bilaterally. Normal respiratory effort without pathologic use of accessory muscles. CARDIOVASCULAR: Heart is regular without murmurs, gallops, or rubs. GI: The abdomen is soft, obese, nondistended, currently with mild soreness to palpation in the epigastric region.  However negative Murphy sign. MUSCULOSKELETAL:  Normal muscle strength and tone in all four extremities.  No peripheral edema or cyanosis. SKIN: No jaundice. NEUROLOGIC:  Motor and sensation is grossly normal.  Cranial nerves are grossly intact. PSYCH:  Alert and oriented to person, place and time. Affect is normal.  Laboratory Analysis: Results for orders placed or performed during the hospital encounter of 03/13/24 (from the past 48 hours)  Lipase, blood     Status: None   Collection Time: 03/13/24  1:57 PM  Result Value Ref Range   Lipase 27 11 - 51 U/L    Comment: Performed at [facility], [address] [place], [place] [address]  Comprehensive metabolic panel     Status: Abnormal   Collection Time: 03/13/24  1:57 PM  Result Value Ref Range   Sodium 137 135 - 145 mmol/L   Potassium 3.9 3.5 - 5.1 mmol/L   Chloride 106 98 - 111 mmol/L   CO2 22 22 - 32 mmol/L   Glucose, Bld 94 70 - 99 mg/dL    Comment: Glucose reference range applies only to samples taken after fasting for at least 8 hours.   BUN 11 6 - 20 mg/dL   Creatinine, Ser 9.12 0.44 - 1.00 mg/dL   Calcium 9.1 8.9 - 89.6 mg/dL   Total Protein 7.4 6.5 - 8.1 g/dL   Albumin 4.1 3.5 - 5.0 g/dL   AST 414 (H) 15 - 41 U/L   ALT 981 (H) 0 - 44 U/L   Alkaline  Phosphatase 194 (H) 38 - 126 U/L   Total Bilirubin 1.3 (H) 0.0 - 1.2 mg/dL   GFR, Estimated >39 >39 mL/min    Comment: (NOTE) Calculated using the CKD-EPI Creatinine Equation (2021)    Anion gap 9 5 - 15    Comment: Performed at [facility], [address] [place], [place] [address]  CBC     Status: Abnormal   Collection Time: 03/13/24  1:57 PM  Result Value Ref Range   WBC 5.6 4.0 - 10.5 K/uL   RBC 4.75 3.87 - 5.11 MIL/uL   Hemoglobin 12.4 12.0 - 15.0 g/dL   HCT 62.6 63.9 - 53.9 %   MCV 78.5 (L) 80.0 - 100.0 fL   MCH 26.1 26.0 - 34.0 pg   MCHC 33.2 30.0 - 36.0 g/dL   RDW 86.3 88.4 - 84.4 %   Platelets 317 150 - 400 K/uL   nRBC 0.0  0.0 - 0.2 %    Comment: Performed at [facility], [address] [place], [place] [address]  Urinalysis, Routine w reflex microscopic -Urine, Clean Catch     Status: Abnormal   Collection Time: 03/13/24  1:57 PM  Result Value Ref Range   Color, Urine AMBER (A) YELLOW    Comment: BIOCHEMICALS MAY BE AFFECTED BY COLOR   APPearance CLEAR (A) CLEAR   Specific Gravity, Urine 1.028 1.005 - 1.030   pH 5.0 5.0 - 8.0   Glucose, UA NEGATIVE NEGATIVE mg/dL   Hgb urine dipstick NEGATIVE NEGATIVE   Bilirubin Urine NEGATIVE NEGATIVE   Ketones, ur NEGATIVE NEGATIVE mg/dL   Protein, ur NEGATIVE NEGATIVE mg/dL   Nitrite NEGATIVE NEGATIVE   Leukocytes,Ua NEGATIVE NEGATIVE    Comment: Performed at [facility], [address] [place], [place] [address]  Iron and TIBC     Status: None   Collection Time: 03/13/24  1:57 PM  Result Value Ref Range   Iron 83 28 - 170 ug/dL   TIBC 666 749 - 549 ug/dL   Saturation Ratios 25 10.4 - 31.8 %   UIBC 250 ug/dL    Comment: Performed at [facility], [address] [place], [place] [address]  Transferrin     Status: None   Collection Time: 03/13/24  1:57 PM  Result Value Ref Range   Transferrin 282 192 - 382 mg/dL    Comment: Performed at [facility], [address] [place], [place] [address]  Ferritin (Iron Binding Protein)     Status: None   Collection Time: 03/13/24  1:57 PM  Result Value Ref Range   Ferritin 65 11 - 307 ng/mL    Comment: Performed at [facility], [address] [place], [place] [address]  POC urine preg, ED     Status: None   Collection Time: 03/13/24  5:06 PM  Result Value Ref Range   Preg Test, Ur NEGATIVE NEGATIVE    Comment:        THE SENSITIVITY OF THIS METHODOLOGY IS >20 mIU/mL.     Imaging: MR ABDOMEN MRCP W WO CONTAST Result Date: 03/13/2024 EXAM: MRCP WITH AND WITHOUT IV CONTRAST 03/13/2024 09:12:30 PM TECHNIQUE: Multisequence, multiplanar magnetic resonance images of the abdomen with and without intravenous contrast. MRCP sequences were performed. 10 mL (gadobutrol  (GADAVIST ) 1 MMOL/ML injection 10 mL GADOBUTROL  1 MMOL/ML IV SOLN) was administered intravenously. COMPARISON: None available. CLINICAL HISTORY: Epigastric pain. Cholelithiasis. FINDINGS: LIVER: Relative decreased signal intensity of the hepatic parenchyma on T2-weighted imaging and paradoxical increase in signal intensity within these structures with in and opposed phase imaging is in keeping with changes of primary hemochromatosis. Correlation with serum iron studies is recommended for confirmation. No focal intrahepatic mass or intrahepatic biliary ductal dilation. Portal vein structures are patent. GALLBLADDER AND BILIARY SYSTEM: Cholelithiasis noted without superimposed pericholecystic inflammatory change to suggest acute cholecystitis. No extrahepatic biliary ductal dilation. Ampullary structures are patent. SPLEEN: Unremarkable. PANCREAS/PANCREATIC DUCT: Relative decreased signal intensity of the pancreas parenchyma on T2-weighted imaging and paradoxical increase in signal intensity within these structures with in and opposed phase imaging is in keeping with changes of primary hemochromatosis. Correlation with serum iron studies is recommended  for confirmation. No pancreatic ductal dilatation. ADRENAL GLANDS: Unremarkable. KIDNEYS: Unremarkable. LYMPH NODES: No enlarged abdominal lymph nodes. VASCULATURE: Unremarkable. PERITONEUM: No ascites. ABDOMINAL WALL: No hernia. No mass. BOWEL: Grossly unremarkable. No bowel obstruction. BONES: No acute abnormality or worrisome osseous lesion. SOFT  TISSUES: Unremarkable. MISCELLANEOUS: Unremarkable. IMPRESSION: 1. Cholelithiasis without superimposed pericholecystic inflammatory change to suggest acute cholecystitis. 2. Findings consistent with primary hemochromatosis; recommend serum iron studies for confirmation. Electronically signed by: Dorethia Molt MD 03/13/2024 11:30 PM EST RP Workstation: [id number]   MR 3D Recon At Scanner Result Date: 03/13/2024 EXAM: MRCP WITH AND WITHOUT IV CONTRAST 03/13/2024 09:12:30 PM TECHNIQUE: Multisequence, multiplanar magnetic resonance images of the abdomen with and without intravenous contrast. MRCP sequences were performed. 10 mL (gadobutrol  (GADAVIST ) 1 MMOL/ML injection 10 mL GADOBUTROL  1 MMOL/ML IV SOLN) was administered intravenously. COMPARISON: None available. CLINICAL HISTORY: Epigastric pain. Cholelithiasis. FINDINGS: LIVER: Relative decreased signal intensity of the hepatic parenchyma on T2-weighted imaging and paradoxical increase in signal intensity within these structures with in and opposed phase imaging is in keeping with changes of primary hemochromatosis. Correlation with serum iron studies is recommended for confirmation. No focal intrahepatic mass or intrahepatic biliary ductal dilation. Portal vein structures are patent. GALLBLADDER AND BILIARY SYSTEM: Cholelithiasis noted without superimposed pericholecystic inflammatory change to suggest acute cholecystitis. No extrahepatic biliary ductal dilation. Ampullary structures are patent. SPLEEN: Unremarkable. PANCREAS/PANCREATIC DUCT: Relative decreased signal intensity of the pancreas parenchyma on  T2-weighted imaging and paradoxical increase in signal intensity within these structures with in and opposed phase imaging is in keeping with changes of primary hemochromatosis. Correlation with serum iron studies is recommended for confirmation. No pancreatic ductal dilatation. ADRENAL GLANDS: Unremarkable. KIDNEYS: Unremarkable. LYMPH NODES: No enlarged abdominal lymph nodes. VASCULATURE: Unremarkable. PERITONEUM: No ascites. ABDOMINAL WALL: No hernia. No mass. BOWEL: Grossly unremarkable. No bowel obstruction. BONES: No acute abnormality or worrisome osseous lesion. SOFT TISSUES: Unremarkable. MISCELLANEOUS: Unremarkable. IMPRESSION: 1. Cholelithiasis without superimposed pericholecystic inflammatory change to suggest acute cholecystitis. 2. Findings consistent with primary hemochromatosis; recommend serum iron studies for confirmation. Electronically signed by: Dorethia Molt MD 03/13/2024 11:30 PM EST RP Workstation: [id number]   US  ABDOMEN LIMITED RUQ (LIVER/GB) Result Date: 03/13/2024 CLINICAL DATA:  Screens Ammon itis with right upper quadrant pain. EXAM: ULTRASOUND ABDOMEN LIMITED RIGHT UPPER QUADRANT COMPARISON:  None Available. FINDINGS: Gallbladder: Moderate cholelithiasis with largest stone measuring 2.3 cm. Gallbladder wall at the upper limits of normal in diameter measuring 3 mm. No adjacent free fluid. Negative sonographic Murphy sign. Common bile duct: Diameter: 5 mm. Liver: Mild increased parenchymal echogenicity compatible with a degree of steatosis. No focal mass. Portal vein is patent on color Doppler imaging with normal direction of blood flow towards the liver. Other: None. IMPRESSION: 1. Moderate cholelithiasis without additional sonographic evidence to suggest acute cholecystitis. 2. Mild hepatic steatosis. Electronically Signed   By: Toribio Agreste M.D.   On: 03/13/2024 16:23    Assessment and Plan: This is a 28 y.o. female with symptomatic cholelithiasis.  - Discussed with patient  the findings on her imaging studies and laboratory studies while in the emergency room.  Her LFTs were elevated but her symptoms had resolved.  I suspect potentially that the patient may have had a stone within the common bile duct that she eventually passed which is how the pain got improved and there was no evidence of choledocholithiasis on MRCP.  The patient currently is doing well but she is interested in surgery to prevent further episodes of this. - Discussed with patient that the plan for robotic assisted cholecystectomy and reviewed with her the surgery at length including the planned incisions, risks of bleeding, infection, injury to surrounding structures, this would be an outpatient procedure, the use of ICG to better evaluate the biliary anatomy, postoperative activity  restrictions, pain control, and she is willing to proceed. - We will schedule the patient for surgery on 04/09/2024.  All of her questions have been answered.  Will repeat labs to make sure her LFTs are back to normal.  She is aware that if they are still elevated we may have to do an intraoperative cholangiogram.  I spent 60 minutes dedicated to the care of this patient on the date of this encounter to include pre-visit review of records, face-to-face time with the patient discussing diagnosis and management, and any post-visit coordination of care.   Aloysius Sheree Plant, MD [facility]       [1]  Allergies Allergen Reactions   Nifedipine Other (See Comments) and Rash    Ha's.   "

## 2024-04-02 NOTE — Progress Notes (Signed)
 "  Provider and student shared ambulatory progress note  Chief Complaint:   No chief complaint on file.    Subjective:  Christine Oconnor is a 29 y.o. female patient in today for:  HPI  ED f/u Recent ED visit for cholelithiasis Previous history of gallstones during pregnancy Had elevated liver enzymes (AST 585, ALT 981) gallbladder removal scheduled for next week (1/13) MRI showed possible hemachromatosis , iron studies were normal  Weight Issues Follow Up:  Patient presents for discussion regarding weight loss.  Current BMI: Body mass index is 42.47 kg/m.  Wt Readings from Last 5 Encounters:  04/02/24 (!) 117 kg (258 lb)  01/23/24 (!) 117.3 kg (258 lb 9.6 oz)  11/28/23 (!) 118.5 kg (261 lb 3.9 oz)  08/24/23 (!) 124.7 kg (275 lb)  07/25/23 (!) 126.6 kg (279 lb 1.6 oz)   Weight changes since last OV: no change Current weight loss medication: metformin  500 mg, phentermine 15 mg, topiramate 25 mg Concern regarding insurance coverage and efficacy of meds, is curious about adjusting medications after surgery   Patient Active Problem List  Diagnosis   BMI 50.0-59.9, adult (CMS-HCC)   PCOS (polycystic ovarian syndrome)   Pregnancy with uncertain fetal viability, single or unspecified fetus (HHS-HCC)   Threatened abortion in first trimester (HHS-HCC)   Amenorrhea   Encounter for supervision of other normal pregnancy in first trimester (HHS-HCC)    Outpatient Medications Prior to Visit  Medication Sig Dispense Refill   acetaminophen (TYLENOL) 325 MG tablet Take by mouth every 6 (six) hours     famotidine (PEPCID) 40 MG tablet Take 1 tablet (40 mg total) by mouth at bedtime 30 tablet 11   metFORMIN (GLUCOPHAGE-XR) 500 MG XR tablet Take 1 tablet (500 mg total) by mouth daily with dinner 30 tablet 11   nystatin (MYCOSTATIN) 100,000 unit/gram powder Apply topically 2 (two) times daily 60 g 2   topiramate (TOPAMAX) 25 MG tablet Take 1 tablet (25 mg total) by mouth 2  (two) times daily 60 tablet 11   phentermine (ADIPEX-P) 15 MG capsule Take 1 capsule (15 mg total) by mouth every morning before breakfast 30 capsule 0   No facility-administered medications prior to visit.    Social History   Socioeconomic History   Marital status: Single  Tobacco Use   Smoking status: Never   Smokeless tobacco: Never  Vaping Use   Vaping status: Never Used  Substance and Sexual Activity   Alcohol use: No   Drug use: No   Sexual activity: Yes    Partners: Male    Birth control/protection: None  Other Topics Concern   Would you please tell us  about the people who live in your home, your pets, or anything else important to your social life? No   Social Drivers of Corporate Investment Banker Strain: Low Risk  (11/28/2023)   Overall Financial Resource Strain (CARDIA)    Difficulty of Paying Living Expenses: Not hard at all  Food Insecurity: No Food Insecurity (11/28/2023)   Hunger Vital Sign    Worried About Running Out of Food in the Last Year: Never true    Ran Out of Food in the Last Year: Never true  Transportation Needs: No Transportation Needs (11/28/2023)   PRAPARE - Administrator, Civil Service (Medical): No    Lack of Transportation (Non-Medical): No  Housing Stability: Low Risk  (11/28/2023)   Housing Stability Vital Sign    Unable to Pay for Housing in  the Last Year: No    Number of Times Moved in the Last Year: 1    Homeless in the Last Year: No    ROS   Objective:   Vitals:   04/02/24 1045  BP: 115/82  Pulse: 91  Weight: (!) 117 kg (258 lb)  PainSc: 0-No pain   Body mass index is 42.47 kg/m.v  Wt Readings from Last 3 Encounters:  04/02/24 (!) 117 kg (258 lb)  01/23/24 (!) 117.3 kg (258 lb 9.6 oz)  11/28/23 (!) 118.5 kg (261 lb 3.9 oz)     Physical Exam Constitutional:      Appearance: Normal appearance.  Eyes:     Conjunctiva/sclera: Conjunctivae normal.     Pupils: Pupils are equal, round, and  reactive to light.  Cardiovascular:     Rate and Rhythm: Normal rate and regular rhythm.     Pulses: Normal pulses.     Heart sounds: Normal heart sounds. No murmur heard.    No friction rub. No gallop.  Pulmonary:     Effort: Pulmonary effort is normal.     Breath sounds: Normal breath sounds. No stridor. No wheezing, rhonchi or rales.  Musculoskeletal:     Cervical back: No tenderness.     Right lower leg: No edema.     Left lower leg: No edema.  Lymphadenopathy:     Cervical: No cervical adenopathy.  Skin:    General: Skin is warm and dry.  Neurological:     General: No focal deficit present.     Mental Status: She is alert and oriented to person, place, and time.      Assessment/Plan:   Diagnoses and all orders for this visit:  Increased storage iron -     HFE Targeted Mutation Analysis For Hemochromatosis; Future  PCOS (polycystic ovarian syndrome) -     Comprehensive Metabolic Panel (CMP); Future -     Complete Blood Count (CBC); Future -     HCG Blood Quantitative, Pregnancy Test; Future -     phentermine (ADIPEX-P) 15 MG capsule; Take 1 capsule (15 mg total) by mouth every morning before breakfast  Low hemoglobin -     Complete Blood Count (CBC); Future  Screening for diabetes mellitus -     Hemoglobin A1C; Future  BMI 45.0-49.9, adult (CMS-HCC) -     phentermine (ADIPEX-P) 15 MG capsule; Take 1 capsule (15 mg total) by mouth every morning before breakfast - Discussed adding Semaglutide following surgery for further weight loss - Will reach out once oral formulation is available as this should be the most affordable option   Student Documentation Attestation: I saw Manuelita Bohr in the presence of supervising physician assistant, Damien Balloon.  MORNA BOOKER, PA Student   Student Documentation Attestation: I saw Manuelita Bohr in the presence of supervising physician assistant, Damien Balloon.    Supervisor Attestation: I was present with the  student and patient during the performance of obtaining the History, Physical Examination and Medical Decision Making (Assessment and Plan)  and I verify the findings documented.   I personally re-performed a Physical Examination (above) and below is additional information pertaining the patient's encounter       Follow up in 6 weeks (on 05/14/2024).  Future Appointments     Date/Time Provider Department Center Visit Type   05/14/2024 11:00 AM (Arrive by 10:45 AM) Balloon Damien Maier, PA [facility] OFFICE VISIT       "

## 2024-04-03 ENCOUNTER — Other Ambulatory Visit: Payer: Self-pay

## 2024-04-03 ENCOUNTER — Encounter
Admission: RE | Admit: 2024-04-03 | Discharge: 2024-04-03 | Disposition: A | Discharge status: Home / Self Care | Source: Ambulatory Visit | Attending: Surgery | Admitting: Surgery

## 2024-04-03 VITALS — Ht 66.0 in | Wt 258.0 lb

## 2024-04-03 DIAGNOSIS — O09299 Supervision of pregnancy with other poor reproductive or obstetric history, unspecified trimester: Secondary | ICD-10-CM

## 2024-04-03 DIAGNOSIS — Z01812 Encounter for preprocedural laboratory examination: Secondary | ICD-10-CM

## 2024-04-03 HISTORY — DX: Depression, unspecified: F32.A

## 2024-04-03 NOTE — Patient Instructions (Addendum)
 Your procedure is scheduled on:   TUESDAY JANUARY 13 Report to the Registration Desk on the 1st floor of the [facility] To find out your arrival time, please call [phone] between 1PM - 3PM on:   MONDAY  JANUARY 12 If your arrival time is 6:00 am, do not arrive before that time as the [facility] entrance doors do not open until 6:00 am.  REMEMBER: Instructions that are not followed completely may result in serious medical risk, up to and including death; or upon the discretion of your surgeon and anesthesiologist your surgery may need to be rescheduled.  Do not eat food after midnight the night before surgery.  No gum chewing or hard candies.  You may however, drink CLEAR liquids up to 2 hours before you are scheduled to arrive for your surgery. Do not drink anything within 2 hours of your scheduled arrival time.  Clear liquids include: - water  - apple juice without pulp - gatorade (not RED colors) - black coffee or tea (Do NOT add milk or creamers to the coffee or tea) Do NOT drink anything that is not on this list.  One week prior to surgery: STARTING WEDNESDAY JANUARY 7  Stop Anti-inflammatories (NSAIDS) such as Advil , Aleve , Ibuprofen , Motrin , Naproxen , Naprosyn  and Aspirin based products such as Excedrin, Goody's Powder, BC Powder. Stop ANY OVER THE COUNTER supplements until after surgery.  You may however, continue to take Tylenol if needed for pain up until the day of surgery.  **Follow guidelines for insulin and diabetes medications.** metFORMIN (GLUCOPHAGE-XR) hold 2 days prior to surgery, last dose SATURDAY JANUARY 10   Continue taking all of your other prescription medications up until the day of surgery. phentermine hold 7 days prior to surgery, last dose WEDNESDAY  JANUARY  7   ON THE DAY OF SURGERY ONLY TAKE THESE MEDICATIONS WITH SIPS OF WATER:  topiramate (TOPAMAX)    No Alcohol for 24 hours before or after surgery.  Do not use any recreational  drugs for at least a week (preferably 2 weeks) before your surgery.  Please be advised that the combination of cocaine and anesthesia may have negative outcomes, up to and including death. If you test positive for cocaine, your surgery will be cancelled.  On the morning of surgery brush your teeth with toothpaste and water, you may rinse your mouth with mouthwash if you wish. Do not swallow any toothpaste or mouthwash.  Use CHG Soap as directed on instruction sheet.  Do not wear jewelry, make-up, hairpins, clips or nail polish.  For welded (permanent) jewelry: bracelets, anklets, waist bands, etc.  Please have this removed prior to surgery.  If it is not removed, there is a chance that hospital personnel will need to cut it off on the day of surgery.  Do not wear lotions, powders, or perfumes.   Do not shave body hair from the neck down 48 hours before surgery.  Contact lenses, hearing aids and dentures may not be worn into surgery.  Do not bring valuables to the hospital. [facility] is not responsible for any missing/lost belongings or valuables.   Notify your doctor if there is any change in your medical condition (cold, fever, infection).  Wear comfortable clothing (specific to your surgery type) to the hospital.  After surgery, you can help prevent lung complications by doing breathing exercises.  Take deep breaths and cough every 1-2 hours.  When coughing or sneezing, hold a pillow firmly against your incision with both  hands. This is called splinting. Doing this helps protect your incision. It also decreases belly discomfort.  If you are being discharged the day of surgery, you will not be allowed to drive home. You will need a responsible individual to drive you home and stay with you for 24 hours after surgery.   If you are taking public transportation, you will need to have a responsible individual with you.  Please call the Pre-admissions Testing Dept. at [phone] if you have any questions about these instructions.  Surgery Visitation Policy:  Patients having surgery or a procedure may have two visitors.  Children under the age of 13 must have an adult with them who is not the patient.   Merchandiser, Retail to address health-related social needs:  [email or web address]                                                                                                               Preparing for Surgery with CHLORHEXIDINE GLUCONATE (CHG) Soap  Chlorhexidine Gluconate (CHG) Soap  o An antiseptic cleaner that kills germs and bonds with the skin to continue killing germs even after washing  o Used for showering the night before surgery and morning of surgery  Before surgery, you can play an important role by reducing the number of germs on your skin.  CHG (Chlorhexidine gluconate) soap is an antiseptic cleanser which kills germs and bonds with the skin to continue killing germs even after washing.  Please do not use if you have an allergy to CHG or antibacterial soaps. If your skin becomes reddened/irritated stop using the CHG.  1. Shower the NIGHT BEFORE SURGERY with CHG soap.  2. If you choose to wash your hair, wash your hair first as usual with your normal shampoo.  3. After shampooing, rinse your hair and body thoroughly to remove the shampoo.  4. Use CHG as you would any other liquid soap. You can apply CHG directly to the skin and wash gently with a clean washcloth.  5. Apply the CHG soap to your body only from the neck down. Do not use on open wounds or open sores. Avoid contact with your eyes, ears, mouth, and genitals (private parts). Wash face and genitals (private parts) with your normal soap.  6. Wash thoroughly, paying special attention to the area where your surgery will be performed.  7. Thoroughly rinse your body with warm water.  8. Do not shower/wash with your normal soap after using and rinsing  off the CHG soap.  9. Do not use lotions, oils, etc., after showering with CHG.  10. Pat yourself dry with a clean towel.  11. Wear clean pajamas to bed the night before surgery.  12. Place clean sheets on your bed the night of your shower and do not sleep with pets.  13. Do not apply any deodorants/lotions/powders.  14. Please wear clean clothes to the hospital.  15. Remember to brush your teeth with your regular toothpaste.

## 2024-04-09 ENCOUNTER — Other Ambulatory Visit: Payer: Self-pay

## 2024-04-09 ENCOUNTER — Ambulatory Visit

## 2024-04-09 ENCOUNTER — Encounter
Admission: RE | Disposition: A | Discharge status: Home / Self Care | Payer: Self-pay | Source: Home / Self Care | Attending: Surgery

## 2024-04-09 ENCOUNTER — Encounter: Payer: Self-pay | Admitting: Surgery

## 2024-04-09 ENCOUNTER — Ambulatory Visit
Admission: RE | Admit: 2024-04-09 | Discharge: 2024-04-09 | Disposition: A | Discharge status: Home / Self Care | Attending: Surgery | Admitting: Surgery

## 2024-04-09 DIAGNOSIS — Z7984 Long term (current) use of oral hypoglycemic drugs: Secondary | ICD-10-CM | POA: Diagnosis not present

## 2024-04-09 DIAGNOSIS — Z6841 Body Mass Index (BMI) 40.0 and over, adult: Secondary | ICD-10-CM | POA: Diagnosis not present

## 2024-04-09 DIAGNOSIS — E6689 Other obesity not elsewhere classified: Secondary | ICD-10-CM | POA: Diagnosis not present

## 2024-04-09 DIAGNOSIS — Z01812 Encounter for preprocedural laboratory examination: Secondary | ICD-10-CM

## 2024-04-09 DIAGNOSIS — K8020 Calculus of gallbladder without cholecystitis without obstruction: Secondary | ICD-10-CM | POA: Diagnosis present

## 2024-04-09 DIAGNOSIS — K8010 Calculus of gallbladder with chronic cholecystitis without obstruction: Secondary | ICD-10-CM | POA: Diagnosis not present

## 2024-04-09 DIAGNOSIS — E119 Type 2 diabetes mellitus without complications: Secondary | ICD-10-CM | POA: Insufficient documentation

## 2024-04-09 DIAGNOSIS — O09299 Supervision of pregnancy with other poor reproductive or obstetric history, unspecified trimester: Secondary | ICD-10-CM

## 2024-04-09 LAB — POCT PREGNANCY, URINE: Preg Test, Ur: NEGATIVE

## 2024-04-09 LAB — GLUCOSE, CAPILLARY
Glucose-Capillary: 93 mg/dL (ref 70–99)
Glucose-Capillary: 159 mg/dL — ABNORMAL HIGH (ref 70–99)

## 2024-04-09 SURGERY — CHOLECYSTECTOMY, ROBOT-ASSISTED, LAPAROSCOPIC
Anesthesia: General

## 2024-04-09 MED ORDER — ORAL CARE MOUTH RINSE: 15.0000 mL | Freq: Once | OROMUCOSAL | Status: AC

## 2024-04-09 MED ORDER — CEFAZOLIN SODIUM-DEXTROSE 2-4 GM/100ML-% IV SOLN: 2.0000 g | INTRAVENOUS | Status: DC

## 2024-04-09 MED ORDER — ACETAMINOPHEN 500 MG PO TABS
1000.0000 mg | ORAL_TABLET | ORAL | Status: AC
  Administered 2024-04-09: 1000 mg via ORAL

## 2024-04-09 MED ORDER — KETAMINE HCL 50 MG/5ML IJ SOSY
PREFILLED_SYRINGE | INTRAMUSCULAR | Status: DC | PRN
  Administered 2024-04-09: 20 mg via INTRAVENOUS

## 2024-04-09 MED ORDER — OXYCODONE HCL 5 MG/5ML PO SOLN: 5.0000 mg | Freq: Once | ORAL | Status: AC | PRN

## 2024-04-09 MED ORDER — DEXAMETHASONE SOD PHOSPHATE PF 10 MG/ML IJ SOLN
INTRAMUSCULAR | Status: AC
  Filled 2024-04-09: qty 1

## 2024-04-09 MED ORDER — FENTANYL CITRATE (PF) 100 MCG/2ML IJ SOLN
INTRAMUSCULAR | Status: AC
  Filled 2024-04-09: qty 2

## 2024-04-09 MED ORDER — CHLORHEXIDINE GLUCONATE 0.12 % MT SOLN
OROMUCOSAL | Status: AC
  Filled 2024-04-09: qty 15

## 2024-04-09 MED ORDER — ONDANSETRON HCL 4 MG/2ML IJ SOLN
INTRAMUSCULAR | Status: AC
  Filled 2024-04-09: qty 2

## 2024-04-09 MED ORDER — MIDAZOLAM HCL 2 MG/2ML IJ SOLN
INTRAMUSCULAR | Status: AC
  Filled 2024-04-09: qty 2

## 2024-04-09 MED ORDER — CEFAZOLIN SODIUM-DEXTROSE 2-3 GM-%(50ML) IV SOLR
INTRAVENOUS | Status: DC | PRN
  Administered 2024-04-09: 2 g via INTRAVENOUS

## 2024-04-09 MED ORDER — ONDANSETRON HCL 4 MG/2ML IJ SOLN
INTRAMUSCULAR | Status: DC | PRN
  Administered 2024-04-09: 4 mg via INTRAVENOUS

## 2024-04-09 MED ORDER — PROPOFOL 10 MG/ML IV BOLUS
INTRAVENOUS | Status: DC | PRN
  Administered 2024-04-09: 200 mg via INTRAVENOUS

## 2024-04-09 MED ORDER — ACETAMINOPHEN 10 MG/ML IV SOLN
INTRAVENOUS | Status: AC
  Filled 2024-04-09: qty 100

## 2024-04-09 MED ORDER — KETOROLAC TROMETHAMINE 30 MG/ML IJ SOLN
INTRAMUSCULAR | Status: DC | PRN
  Administered 2024-04-09: 15 mg via INTRAVENOUS

## 2024-04-09 MED ORDER — PROPOFOL 10 MG/ML IV BOLUS
INTRAVENOUS | Status: AC
  Filled 2024-04-09: qty 40

## 2024-04-09 MED ORDER — KETAMINE HCL 50 MG/5ML IJ SOSY
PREFILLED_SYRINGE | INTRAMUSCULAR | Status: AC
  Filled 2024-04-09: qty 5

## 2024-04-09 MED ORDER — SUCCINYLCHOLINE CHLORIDE 200 MG/10ML IV SOSY
PREFILLED_SYRINGE | INTRAVENOUS | Status: AC
  Filled 2024-04-09: qty 10

## 2024-04-09 MED ORDER — LACTATED RINGERS IV SOLN: INTRAVENOUS | Status: DC

## 2024-04-09 MED ORDER — ACETAMINOPHEN 500 MG PO TABS
ORAL_TABLET | ORAL | Status: AC
  Filled 2024-04-09: qty 2

## 2024-04-09 MED ORDER — CHLORHEXIDINE GLUCONATE CLOTH 2 % EX PADS: 6.0000 | MEDICATED_PAD | Freq: Once | CUTANEOUS | Status: DC

## 2024-04-09 MED ORDER — LIDOCAINE HCL (CARDIAC) PF 100 MG/5ML IV SOSY
PREFILLED_SYRINGE | INTRAVENOUS | Status: DC | PRN
  Administered 2024-04-09: 100 mg via INTRAVENOUS

## 2024-04-09 MED ORDER — LIDOCAINE HCL (PF) 2 % IJ SOLN
INTRAMUSCULAR | Status: AC
  Filled 2024-04-09: qty 5

## 2024-04-09 MED ORDER — MIDAZOLAM HCL (PF) 2 MG/2ML IJ SOLN
INTRAMUSCULAR | Status: DC | PRN
  Administered 2024-04-09: 2 mg via INTRAVENOUS

## 2024-04-09 MED ORDER — PHENYLEPHRINE 80 MCG/ML (10ML) SYRINGE FOR IV PUSH (FOR BLOOD PRESSURE SUPPORT)
PREFILLED_SYRINGE | INTRAVENOUS | Status: AC
  Filled 2024-04-09: qty 10

## 2024-04-09 MED ORDER — OXYCODONE HCL 5 MG PO TABS
5.0000 mg | ORAL_TABLET | ORAL | 0 refills | Status: DC | PRN
  Filled 2024-04-09: qty 30, 5d supply, fill #0

## 2024-04-09 MED ORDER — OXYCODONE HCL 5 MG PO TABS
5.0000 mg | ORAL_TABLET | Freq: Once | ORAL | Status: AC | PRN
  Administered 2024-04-09: 5 mg via ORAL

## 2024-04-09 MED ORDER — PHENYLEPHRINE 80 MCG/ML (10ML) SYRINGE FOR IV PUSH (FOR BLOOD PRESSURE SUPPORT)
PREFILLED_SYRINGE | INTRAVENOUS | Status: DC | PRN
  Administered 2024-04-09: 160 ug via INTRAVENOUS
  Administered 2024-04-09: 80 ug via INTRAVENOUS

## 2024-04-09 MED ORDER — MEPERIDINE HCL 25 MG/ML IJ SOLN: 6.2500 mg | INTRAMUSCULAR | Status: DC | PRN

## 2024-04-09 MED ORDER — ONDANSETRON HCL 4 MG/2ML IJ SOLN
4.0000 mg | Freq: Once | INTRAMUSCULAR | Status: AC | PRN
  Administered 2024-04-09: 4 mg via INTRAVENOUS

## 2024-04-09 MED ORDER — ACETAMINOPHEN 500 MG PO TABS: 1000.0000 mg | ORAL_TABLET | Freq: Four times a day (QID) | ORAL | Status: AC | PRN

## 2024-04-09 MED ORDER — DEXMEDETOMIDINE HCL IN NACL 80 MCG/20ML IV SOLN
INTRAVENOUS | Status: DC | PRN
  Administered 2024-04-09 (×2): 4 ug via INTRAVENOUS
  Administered 2024-04-09: 8 ug via INTRAVENOUS

## 2024-04-09 MED ORDER — CEFAZOLIN SODIUM-DEXTROSE 2-4 GM/100ML-% IV SOLN
INTRAVENOUS | Status: AC
  Filled 2024-04-09: qty 100

## 2024-04-09 MED ORDER — FENTANYL CITRATE (PF) 100 MCG/2ML IJ SOLN
INTRAMUSCULAR | Status: DC | PRN
  Administered 2024-04-09 (×2): 50 ug via INTRAVENOUS

## 2024-04-09 MED ORDER — ROCURONIUM BROMIDE 10 MG/ML (PF) SYRINGE
PREFILLED_SYRINGE | INTRAVENOUS | Status: AC
  Filled 2024-04-09: qty 10

## 2024-04-09 MED ORDER — DEXAMETHASONE SOD PHOSPHATE PF 10 MG/ML IJ SOLN
INTRAMUSCULAR | Status: DC | PRN
  Administered 2024-04-09: 5 mg via INTRAVENOUS

## 2024-04-09 MED ORDER — OXYCODONE HCL 5 MG PO TABS
ORAL_TABLET | ORAL | Status: AC
  Filled 2024-04-09: qty 1

## 2024-04-09 MED ORDER — GABAPENTIN 300 MG PO CAPS
300.0000 mg | ORAL_CAPSULE | ORAL | Status: AC
  Administered 2024-04-09: 300 mg via ORAL

## 2024-04-09 MED ORDER — INDOCYANINE GREEN 25 MG IJ SOLR
1.2500 mg | INTRAMUSCULAR | Status: AC
  Administered 2024-04-09: 1.25 mg via INTRAVENOUS

## 2024-04-09 MED ORDER — GABAPENTIN 300 MG PO CAPS
ORAL_CAPSULE | ORAL | Status: AC
  Filled 2024-04-09: qty 1

## 2024-04-09 MED ORDER — ROCURONIUM BROMIDE 100 MG/10ML IV SOLN
INTRAVENOUS | Status: DC | PRN
  Administered 2024-04-09: 20 mg via INTRAVENOUS
  Administered 2024-04-09: 10 mg via INTRAVENOUS
  Administered 2024-04-09: 50 mg via INTRAVENOUS

## 2024-04-09 MED ORDER — SEVOFLURANE IN SOLN
RESPIRATORY_TRACT | Status: AC
  Filled 2024-04-09: qty 250

## 2024-04-09 MED ORDER — KETOROLAC TROMETHAMINE 30 MG/ML IJ SOLN
INTRAMUSCULAR | Status: AC
  Filled 2024-04-09: qty 1

## 2024-04-09 MED ORDER — LACTATED RINGERS IV SOLN: INTRAVENOUS | Status: DC | PRN

## 2024-04-09 MED ORDER — SUGAMMADEX SODIUM 200 MG/2ML IV SOLN
INTRAVENOUS | Status: DC | PRN
  Administered 2024-04-09: 230.4 mg via INTRAVENOUS

## 2024-04-09 MED ORDER — BUPIVACAINE-EPINEPHRINE (PF) 0.5% -1:200000 IJ SOLN
INTRAMUSCULAR | Status: AC
  Filled 2024-04-09: qty 30

## 2024-04-09 MED ORDER — 0.9 % SODIUM CHLORIDE (POUR BTL) OPTIME
TOPICAL | Status: DC | PRN
  Administered 2024-04-09: 1000 mL

## 2024-04-09 MED ORDER — BUPIVACAINE-EPINEPHRINE (PF) 0.5% -1:200000 IJ SOLN
INTRAMUSCULAR | Status: DC | PRN
  Administered 2024-04-09: 30 mL

## 2024-04-09 MED ORDER — EPHEDRINE SULFATE (PRESSORS) 25 MG/5ML IV SOSY
PREFILLED_SYRINGE | INTRAVENOUS | Status: DC | PRN
  Administered 2024-04-09 (×2): 5 mg via INTRAVENOUS

## 2024-04-09 MED ORDER — DEXMEDETOMIDINE HCL IN NACL 80 MCG/20ML IV SOLN
INTRAVENOUS | Status: AC
  Filled 2024-04-09: qty 20

## 2024-04-09 MED ORDER — EPHEDRINE 5 MG/ML INJ
INTRAVENOUS | Status: AC
  Filled 2024-04-09: qty 5

## 2024-04-09 MED ORDER — FENTANYL CITRATE (PF) 100 MCG/2ML IJ SOLN
25.0000 ug | INTRAMUSCULAR | Status: DC | PRN
  Administered 2024-04-09 (×2): 50 ug via INTRAVENOUS
  Administered 2024-04-09 (×2): 25 ug via INTRAVENOUS

## 2024-04-09 MED ORDER — CHLORHEXIDINE GLUCONATE 0.12 % MT SOLN
15.0000 mL | Freq: Once | OROMUCOSAL | Status: AC
  Administered 2024-04-09: 15 mL via OROMUCOSAL

## 2024-04-09 MED ORDER — IBUPROFEN 600 MG PO TABS
600.0000 mg | ORAL_TABLET | Freq: Three times a day (TID) | ORAL | 1 refills | Status: AC | PRN
  Filled 2024-04-09: qty 60, 20d supply, fill #0

## 2024-04-09 MED ORDER — INDOCYANINE GREEN 25 MG IJ SOLR
INTRAMUSCULAR | Status: AC
  Filled 2024-04-09: qty 10

## 2024-04-09 SURGICAL SUPPLY — 34 items
BAG PRESSURE INF REUSE 1000 (BAG) IMPLANT
CANNULA CAP OBTURATR AIRSEAL 8 (CAP) IMPLANT
CAUTERY HOOK MNPLR 1.6 DVNC XI (INSTRUMENTS) ×1 IMPLANT
CLIP LIGATING HEMO O LOK GREEN (MISCELLANEOUS) ×1 IMPLANT
DEFOGGER SCOPE WARM SEASHARP (MISCELLANEOUS) ×1 IMPLANT
DERMABOND ADVANCED .7 DNX12 (GAUZE/BANDAGES/DRESSINGS) ×1 IMPLANT
DRAPE ARM DVNC X/XI (DISPOSABLE) ×4 IMPLANT
DRAPE COLUMN DVNC XI (DISPOSABLE) ×1 IMPLANT
ELECTRODE CAUTERY BLDE TIP 2.5 (TIP) ×1 IMPLANT
ELECTRODE REM PT RTRN 9FT ADLT (ELECTROSURGICAL) ×1 IMPLANT
FORCEPS BPLR R/ABLATION 8 DVNC (INSTRUMENTS) ×1 IMPLANT
FORCEPS PROGRASP DVNC XI (FORCEP) ×1 IMPLANT
GLOVE SURG SYN 7.0 PF PI (GLOVE) ×2 IMPLANT
GLOVE SURG SYN 7.5 PF PI (GLOVE) ×2 IMPLANT
GOWN STRL REUS W/ TWL LRG LVL3 (GOWN DISPOSABLE) ×4 IMPLANT
IRRIGATOR SUCT 8 DISP DVNC XI (IRRIGATION / IRRIGATOR) IMPLANT
IV 0.9% NACL 1000 ML (IV SOLUTION) IMPLANT
KIT PINK PAD W/HEAD ARM REST (MISCELLANEOUS) ×1 IMPLANT
LABEL OR SOLS (LABEL) ×1 IMPLANT
MANIFOLD NEPTUNE II (INSTRUMENTS) ×1 IMPLANT
NEEDLE HYPO 22X1.5 SAFETY MO (MISCELLANEOUS) ×1 IMPLANT
NS IRRIG 500ML POUR BTL (IV SOLUTION) ×1 IMPLANT
OBTURATOR OPTICALSTD 8 DVNC (TROCAR) ×1 IMPLANT
PACK LAP CHOLECYSTECTOMY (MISCELLANEOUS) ×1 IMPLANT
SEAL UNIV 5-12 XI (MISCELLANEOUS) ×4 IMPLANT
SET TUBE FILTERED XL AIRSEAL (SET/KITS/TRAYS/PACK) IMPLANT
SET TUBE SMOKE EVAC HIGH FLOW (TUBING) ×1 IMPLANT
SOLN STERILE WATER 500 ML (IV SOLUTION) ×1 IMPLANT
SOLUTION ELECTROSURG ANTI STCK (MISCELLANEOUS) ×1 IMPLANT
SPIKE FLUID TRANSFER (MISCELLANEOUS) ×1 IMPLANT
SUT MNCRL AB 4-0 PS2 18 (SUTURE) ×1 IMPLANT
SUT VIC AB 3-0 SH 27X BRD (SUTURE) IMPLANT
SUT VICRYL 0 UR6 27IN ABS (SUTURE) ×2 IMPLANT
SYSTEM BAG RETRIEVAL 10MM (BASKET) ×1 IMPLANT

## 2024-04-09 NOTE — Op Note (Signed)
" °  Procedure Date:  04/09/2024  Pre-operative Diagnosis:  Symptomatic cholelithiasis  Post-operative Diagnosis: Symptomatic cholelithiasis  Procedure:  Robotic assisted cholecystectomy with ICG FireFly cholangiogram  Surgeon:  Aloysius Sheree Plant, MD  Anesthesia:  General endotracheal  Estimated Blood Loss:  10 ml  Specimens:  gallbladder  Complications:  None  Indications for Procedure:  This is a 29 y.o. female who presents with abdominal pain and workup revealing symptomatic cholelithiasis.  The benefits, complications, treatment options, and expected outcomes were discussed with the patient. The risks of bleeding, infection, recurrence of symptoms, failure to resolve symptoms, bile duct damage, bile duct leak, retained common bile duct stone, bowel injury, and need for further procedures were all discussed with the patient and she was willing to proceed.  Description of Procedure: The patient was correctly identified in the preoperative area and brought into the operating room.  The patient was placed supine with VTE prophylaxis in place.  Appropriate time-outs were performed.  Anesthesia was induced and the patient was intubated.  Appropriate antibiotics were infused.  The abdomen was prepped and draped in a sterile fashion. An infraumbilical incision was made. A cutdown technique was used to enter the abdominal cavity without injury, and a 12 mm robotic port was inserted.  Pneumoperitoneum was obtained with appropriate opening pressures.  Three 8-mm ports were placed in the mid abdomen at the level of the umbilicus under direct visualization.  The DaVinci platform was docked, camera targeted, and instruments were placed under direct visualization.  The gallbladder was identified.  The fundus was grasped and retracted cephalad.  Adhesions were lysed bluntly and with electrocautery. The infundibulum was grasped and retracted laterally, exposing the peritoneum overlying the gallbladder.  This  was incised with electrocautery and extended on either side of the gallbladder.  FireFly cholangiogram was then obtained, and we were able to clearly identify the cystic duct and common bile duct.  The cystic duct and cystic artery were carefully dissected with combination of cautery and blunt dissection.  Both were clipped twice proximally and once distally, cutting in between.  The gallbladder was taken from the gallbladder fossa in a retrograde fashion with electrocautery. The gallbladder was placed in an Endocatch bag. The liver bed was inspected and any bleeding was controlled with electrocautery. The right upper quadrant was then inspected again revealing intact clips, no bleeding, and no ductal injury.    The 8 mm ports were removed under direct visualization and the 12 mm port was removed.  The Endocatch bag was brought out via the umbilical incision. The fascial opening was closed using 0 vicryl suture.  Local anesthetic was infused in all incisions and the incisions were closed with 4-0 Monocryl.  The wounds were cleaned and sealed with DermaBond.  The patient was emerged from anesthesia and extubated and brought to the recovery room for further management.  The patient tolerated the procedure well and all counts were correct at the end of the case.   Aloysius Sheree Plant, MD     "

## 2024-04-09 NOTE — Interval H&P Note (Signed)
 History and Physical Interval Note:  04/09/2024 7:05 AM  Christine Oconnor  has presented today for surgery, with the diagnosis of Symptomatic cholelithiasis.  The various methods of treatment have been discussed with the patient and family. After consideration of risks, benefits and other options for treatment, the patient has consented to  Procedures with comments: CHOLECYSTECTOMY, ROBOT-ASSISTED, LAPAROSCOPIC (N/A) - w/ICG as a surgical intervention.  The patient's history has been reviewed, patient examined, no change in status, stable for surgery.  I have reviewed the patient's chart and labs.  Questions were answered to the patient's satisfaction.     [name]

## 2024-04-09 NOTE — Anesthesia Preprocedure Evaluation (Addendum)
"                                    Anesthesia Evaluation  Patient identified by MRN, date of birth, ID band Patient awake    Reviewed: Allergy & Precautions, NPO status , Patient's Chart, lab work & pertinent test results, reviewed documented beta blocker date and time   Airway Mallampati: I  TM Distance: >3 FB     Dental no notable dental hx.    Pulmonary    Pulmonary exam normal        Cardiovascular Normal cardiovascular exam     Neuro/Psych    GI/Hepatic + indigestion no reflux   Endo/Other  diabetes  Class 4 obesity  Renal/GU      Musculoskeletal   Abdominal  (+) + obese  Peds  Hematology   Anesthesia Other Findings Patient had gestational DM, and PIH PO meds for weight loss  Uhcg(-)   Reproductive/Obstetrics                              Anesthesia Physical Anesthesia Plan  ASA: 2  Anesthesia Plan: General   Post-op Pain Management:    Induction:   PONV Risk Score and Plan:   Airway Management Planned: Oral ETT  Additional Equipment:   Intra-op Plan:   Post-operative Plan: Extubation in OR  Informed Consent: I have reviewed the patients History and Physical, chart, labs and discussed the procedure including the risks, benefits and alternatives for the proposed anesthesia with the patient or authorized representative who has indicated his/her understanding and acceptance.       Plan Discussed with: CRNA  Anesthesia Plan Comments:          Anesthesia Quick Evaluation  "

## 2024-04-09 NOTE — Anesthesia Procedure Notes (Signed)
 Procedure Name: Intubation Date/Time: 04/09/2024 7:38 AM  Performed by: Duwayne Craven, CRNAPre-anesthesia Checklist: Patient identified, Patient being monitored, Timeout performed, Emergency Drugs available and Suction available Patient Re-evaluated:Patient Re-evaluated prior to induction Oxygen Delivery Method: Circle system utilized Preoxygenation: Pre-oxygenation with 100% oxygen Induction Type: IV induction Ventilation: Mask ventilation without difficulty Laryngoscope Size: 3 and McGrath Grade View: Grade I Tube type: Oral Tube size: 7.0 mm Number of attempts: 1 Airway Equipment and Method: Stylet Placement Confirmation: ETT inserted through vocal cords under direct vision, positive ETCO2 and breath sounds checked- equal and bilateral Secured at: 21 cm Tube secured with: Tape Dental Injury: Teeth and Oropharynx as per pre-operative assessment

## 2024-04-09 NOTE — Transfer of Care (Signed)
 Immediate Anesthesia Transfer of Care Note  Patient: Christine Oconnor  Procedure(s) Performed: CHOLECYSTECTOMY, ROBOT-ASSISTED, LAPAROSCOPIC  Patient Location: PACU  Anesthesia Type:General  Level of Consciousness: drowsy  Airway & Oxygen Therapy: Patient Spontanous Breathing and Patient connected to face mask oxygen  Post-op Assessment: Report given to RN, Post -op Vital signs reviewed and stable, and Patient moving all extremities X 4  Post vital signs: Reviewed and stable  Last Vitals:  Vitals Value Taken Time  BP 157/95 04/09/24 09:30  Temp 36.4 C 04/09/24 09:26  Pulse 94 04/09/24 09:32  Resp 22 04/09/24 09:32  SpO2 100 % 04/09/24 09:32  Vitals shown include unfiled device data.  Last Pain:  Vitals:   04/09/24 0718  PainSc: 0-No pain         Complications: No notable events documented.

## 2024-04-09 NOTE — Discharge Instructions (Signed)

## 2024-04-09 NOTE — Anesthesia Postprocedure Evaluation (Signed)
"   Anesthesia Post Note  Patient: Christine Oconnor  Procedure(s) Performed: CHOLECYSTECTOMY, ROBOT-ASSISTED, LAPAROSCOPIC  Patient location during evaluation: PACU Anesthesia Type: General Level of consciousness: awake and alert Pain management: pain level controlled Vital Signs Assessment: post-procedure vital signs reviewed and stable Respiratory status: spontaneous breathing Cardiovascular status: blood pressure returned to baseline Postop Assessment: no apparent nausea or vomiting Anesthetic complications: no   No notable events documented.   Last Vitals:  Vitals:   04/09/24 0945 04/09/24 1000  BP: (!) 137/98 121/88  Pulse: 82 79  Resp: 19 15  Temp:    SpO2: 100% 96%    Last Pain:  Vitals:   04/09/24 0952  PainSc: 8                  Christine Oconnor      "

## 2024-04-10 LAB — SURGICAL PATHOLOGY

## 2024-04-24 ENCOUNTER — Ambulatory Visit: Admitting: Surgery

## 2024-04-24 ENCOUNTER — Encounter: Admitting: Physician Assistant

## 2024-04-24 ENCOUNTER — Encounter: Payer: Self-pay | Admitting: Surgery

## 2024-04-24 VITALS — BP 124/87 | HR 80 | Temp 98.0°F | Ht 66.0 in | Wt 261.4 lb

## 2024-04-24 DIAGNOSIS — Z09 Encounter for follow-up examination after completed treatment for conditions other than malignant neoplasm: Secondary | ICD-10-CM

## 2024-04-24 DIAGNOSIS — K8020 Calculus of gallbladder without cholecystitis without obstruction: Secondary | ICD-10-CM

## 2024-04-24 NOTE — Progress Notes (Signed)
 04/24/2024  HPI: Christine Oconnor is a 29 y.o. female s/p robotic assisted cholecystectomy on 04/09/2024.  Patient presents today for follow-up.  She reports that she is doing well.  Reports also some soreness at the umbilicus and also she feels that there is a suture that is trying to poke through the right lateral incision.  Denies any nausea or vomiting.  Vital signs: BP 124/87   Pulse 80   Temp 98 F (36.7 C) (Oral)   Ht 5' 6 (1.676 m)   Wt 261 lb 6.4 oz (118.6 kg)   LMP 03/21/2024 (Approximate)   SpO2 98%   BMI 42.19 kg/m    Physical Exam: Constitutional: No acute distress Abdomen: Soft, obese, nondistended, appropriate sore to palpation.  Incisions are healing well and are clean, dry, intact.  There is a small amount of scab overlying the umbilicus incision in the right lateral incision.  There is a very short section of Monocryl suture that is poking through the skin.  This was lifted and cut flush with the skin.  Assessment/Plan: This is a 29 y.o. female s/p robotic assisted cholecystectomy.  - Patient is recovering well from her surgery without any complications.  Discussed with her that the scab tissue will fall off and this skin underneath will finish healing up without issues.  She can apply vitamin E lotion once the scab falls off to allow the scars to hopefully be more faint. - Discussed with her activity restrictions. - Follow-up as needed.   Aloysius Sheree Plant, MD [facility]

## 2024-04-24 NOTE — Patient Instructions (Signed)
